# Patient Record
Sex: Male | Born: 1955
Health system: Southern US, Community
[De-identification: ages and names within clinical notes are randomized; demographics above are authoritative.]

## PROBLEM LIST (undated history)

## (undated) DIAGNOSIS — M2550 Pain in unspecified joint: Secondary | ICD-10-CM

## (undated) DIAGNOSIS — E785 Hyperlipidemia, unspecified: Secondary | ICD-10-CM

## (undated) DIAGNOSIS — I10 Essential (primary) hypertension: Secondary | ICD-10-CM

## (undated) HISTORY — PX: TONSILLECTOMY: SUR1361

## (undated) HISTORY — DX: Pain in unspecified joint: M25.50

---

## 2001-09-26 ENCOUNTER — Encounter: Payer: Self-pay | Admitting: Emergency Medicine

## 2001-09-26 ENCOUNTER — Emergency Department (HOSPITAL_COMMUNITY): Admission: EM | Admit: 2001-09-26 | Discharge: 2001-09-27 | Payer: Self-pay | Admitting: Emergency Medicine

## 2015-01-04 ENCOUNTER — Emergency Department
Admission: EM | Admit: 2015-01-04 | Discharge: 2015-01-04 | Disposition: A | Discharge status: Home / Self Care | Payer: BLUE CROSS/BLUE SHIELD | Source: Home / Self Care | Attending: Family Medicine | Admitting: Family Medicine

## 2015-01-04 ENCOUNTER — Encounter: Payer: Self-pay | Admitting: *Deleted

## 2015-01-04 DIAGNOSIS — T2220XA Burn of second degree of shoulder and upper limb, except wrist and hand, unspecified site, initial encounter: Secondary | ICD-10-CM | POA: Diagnosis not present

## 2015-01-04 HISTORY — DX: Essential (primary) hypertension: I10

## 2015-01-04 MED ORDER — TETANUS-DIPHTH-ACELL PERTUSSIS 5-2.5-18.5 LF-MCG/0.5 IM SUSP
0.5000 mL | Freq: Once | INTRAMUSCULAR | Status: AC
  Administered 2015-01-04: 0.5 mL via INTRAMUSCULAR

## 2015-01-04 MED ORDER — HYDROCODONE-ACETAMINOPHEN 5-325 MG PO TABS: 1.0000 | ORAL_TABLET | ORAL | Status: DC | PRN

## 2015-01-04 NOTE — Discharge Instructions (Signed)
Leave bandage in place until follow-up visit tomorrow.  Keep clean and dry.  Elevate arm at night.  Apply ice pack for 20 to 30 minutes, 3 to 4 times daily  Continue until pain decreases.  May take Ibuprofen , 4 tabs every 8 hours with food.    Second-Degree Burn A second-degree burn affects the 2 outer layers of skin. The outer layer (epidermis) and the layer underneath it (dermis) are both burned. Another name for this type of burn is a partial thickness burn. A second-degree burn may be called minor or major. This depends on the size of the burn. It also depends on what parts of the skin are burned. Minor burns may be treated with first aid. Major burns are a medical emergency. A second-degree burn is worse than a first-degree burn, but not as bad as a third-degree burn. A first-degree burn affects only the epidermis. A third-degree burn goes through all the layers of skin. A second-degree burn usually heals in 3 to 4 weeks. A minor second-degree burn usually does not leave a scar.Deeper second-degree burns may lead to scarring of the skin or contractures over joints.Contractures are scars that form over joints and may lead to reduced mobility at those joints. CAUSES  Heat (thermal) injury. This happens when skin comes in contact with something very hot. It could be a flame, a hot object, hot liquid, or steam. Most second-degree burns are thermal injuries.  Radiation. Sunlight is one type of radiation that can burn the skin. Another type of radiation is used to heat food. Radiation is also used to treat some diseases, such as cancer. All types of radiation can burn the skin. Sunlight usually causes a first-degree burn. Radiation used for heating food or treating a disease can cause a second-degree burn.  Electricity. Electrical burns can cause more damage under the skin than on the surface. They should always be treated as major burns.  Chemicals. Many chemicals can burn the skin. The burn  should be flushed with cool water and checked by an emergency caregiver. SYMPTOMS Symptoms of second-degree burns include:  Severe pain.  Extreme tenderness.  Deep redness.  Blistered skin.  Skin that has changed color.It might look blotchy, wet, or shiny.  Swelling. TREATMENT Some second-degree burns may need to be treated in a hospital. These include major burns, electrical burns, and chemical burns. Many other second-degree burns can be treated with regular first aid, such as:  Cooling the burn. Use cool, germ-free (sterile) salt water. Place the burned area of skin into a tub of water, or cover the burned area with clean, wet towels.  Taking pain medicine.  Removing the dead skin from broken blisters. A trained caregiver may do this. Do not pop blisters.  Gently washing your skin with mild soap.  Covering the burned area with a cream.Silver sulfadiazine is a cream for burns. An antibiotic cream, such as bacitracin, may also be used to fight infection. Do not use other ointments or creams unless your caregiver says it is okay.  Protecting the burn with a sterile, non-sticky bandage.  Bandaging fingers and toes separately. This keeps them from sticking together.  Taking an antibiotic. This can help prevent infection.  Getting a tetanus shot. HOME CARE INSTRUCTIONS Medication  Take any medicine prescribed by your caregiver. Follow the directions carefully.  Ask your caregiver if you can take over-the-counter medicine to relieve pain and swelling. Do not give aspirin to children.  Make sure your caregiver knows about  all other medicines you take.This includes over-the-counter medicines. Burn care  You will need to change the bandage on your burn. You may need to do this 2 or 3 times each day.  Gently clean the burned area.  Put ointment on it.  Cover the burn with a sterile bandage.  For some deeper burns or burns that cover a large area, compression garments  may be prescribed. These garments can help minimize scarring and protect your mobility.  Do not put butter or oil on your skin. Use only the cream prescribed by your caregiver.  Do not put ice on your burn.  Do not break blisters on your skin.  Keep the bandaged area dry. You might need to take a sponge bath for awhile.Ask your caregiver when you can take a shower or a tub bath again.  Do not scratch an itchy burn. Your caregiver may give you medicine to relieve very bad itching.  Infection is a big danger after a second-degree burn. Tell your caregiver right away if you have signs of infection, such as:  Redness or changing color in the burned area.  Fluid leaking from the burn.  Swelling in the burn area.  A bad smell coming from the wound. Follow-up  Keep all follow-up appointments.This is important. This is how your caregiver can tell if your treatment is working.  Protect your burn from sunlight.Use sunscreen whenever you go outside.Burned areas may be sensitive to the sun for up to 1 year. Exposure to the sun may also cause permanent darkening of scars. SEEK MEDICAL CARE IF:  You have any questions about medicines.  You have any questions about your treatment.  You wonder if it is okay to do a particular activity.  You develop a fever of more than 100.5 F (38.1 C). SEEK IMMEDIATE MEDICAL CARE IF:  You think your burn might be infected. It may change color, become red, leak fluid, swell, or smell bad.  You develop a fever of more than 102 F (38.9 C). Document Released: 11/07/2010 Document Revised: 08/28/2011 Document Reviewed: 11/07/2010 [facility] Patient Information 2015 [place], [place]. This information is not intended to replace advice given to you by your health care provider. Make sure you discuss any questions you have with your health care provider.
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
## 2015-01-05 ENCOUNTER — Emergency Department (INDEPENDENT_AMBULATORY_CARE_PROVIDER_SITE_OTHER)
Admission: EM | Admit: 2015-01-05 | Discharge: 2015-01-05 | Disposition: A | Discharge status: Home / Self Care | Payer: BLUE CROSS/BLUE SHIELD | Source: Home / Self Care | Attending: Family Medicine | Admitting: Family Medicine

## 2015-01-05 DIAGNOSIS — Z4800 Encounter for change or removal of nonsurgical wound dressing: Secondary | ICD-10-CM

## 2015-01-05 DIAGNOSIS — T23201D Burn of second degree of right hand, unspecified site, subsequent encounter: Secondary | ICD-10-CM | POA: Diagnosis not present

## 2015-01-05 NOTE — Discharge Instructions (Signed)
Leave present bandage in place until follow-up visit.  Keep clean and dry.  Return early if fever, increased pain or swelling occur.
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
## 2015-01-05 NOTE — ED Provider Notes (Signed)
CSN: [id number]     Arrival date & time 01/05/15  1335 History   First MD Initiated Contact with Patient 01/05/15 1359     Chief Complaint  Patient presents with  . Dressing Change      HPI Comments: Patient returns for dressing change of burn right arm.  He has no complaints today, and reports that his pain has decreased.  The history is provided by the patient.    Past Medical History  Diagnosis Date  . Hypertension    No past surgical history on file. Family History  Problem Relation Age of Onset  . COPD Father   . Heart attack Father    History  Substance Use Topics  . Smoking status: Former Games developermoker  . Smokeless tobacco: Not on file  . Alcohol Use: No    Review of Systems No fevers, chills, and sweats.  Review of systems otherwise negative Allergies  Penicillins  Home Medications   Prior to Admission medications   Medication Sig Start Date End Date Taking? Authorizing Provider  HYDROcodone-acetaminophen (NORCO/VICODIN) 5-325 MG per tablet Take 1 tablet by mouth every 4 (four) hours as needed for severe pain. 01/04/15   Lattie HawStephen A Beese, MD  losartan (COZAAR) 25 MG tablet Take 25 mg by mouth daily.    Historical Provider, MD  MULTIPLE VITAMIN PO Take by mouth.    Historical Provider, MD   BP 119/78 mmHg  Pulse 103  Temp(Src) 98 F (36.7 C) (Oral)  Resp 14  SpO2 95% Physical Exam Patient appears comfortable and in no distress. Right arm:  Bandages removed.  No purulent drainage or erythema.  No swelling  ED Course  Procedures Debrudement.  Wound lightly debrided of non-viable epidermis.   MDM   1. Dressing change or removal, nonsurgical wound    Cleansed wound with sterile saline and applied Silvadene cream.  Applied Mepelex Border dressings and secured with CoBan wrap. Leave present bandage in place until follow-up visit.  Keep clean and dry.  Return early if fever, increased pain or swelling occur. Return in two days for dressing change.    Lattie HawStephen A  Beese, MD 01/05/15 1444

## 2015-01-07 ENCOUNTER — Encounter: Payer: Self-pay | Admitting: Emergency Medicine

## 2015-01-07 ENCOUNTER — Emergency Department
Admission: EM | Admit: 2015-01-07 | Discharge: 2015-01-07 | Disposition: A | Discharge status: Home / Self Care | Payer: BLUE CROSS/BLUE SHIELD | Source: Home / Self Care

## 2015-01-07 NOTE — ED Notes (Signed)
Dressing change for burn on right arm

## 2015-01-11 ENCOUNTER — Emergency Department (INDEPENDENT_AMBULATORY_CARE_PROVIDER_SITE_OTHER)
Admission: EM | Admit: 2015-01-11 | Discharge: 2015-01-11 | Disposition: A | Discharge status: Home / Self Care | Payer: BLUE CROSS/BLUE SHIELD | Source: Home / Self Care | Attending: Family Medicine | Admitting: Family Medicine

## 2015-01-11 ENCOUNTER — Encounter: Payer: Self-pay | Admitting: Emergency Medicine

## 2015-01-11 DIAGNOSIS — Z4800 Encounter for change or removal of nonsurgical wound dressing: Secondary | ICD-10-CM | POA: Diagnosis not present
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
## 2015-01-11 NOTE — Discharge Instructions (Signed)
Dressing Change A dressing is a material placed over wounds. It keeps the wound clean, dry, and protected from further injury.  BEFORE YOU BEGIN  Get your supplies together. Things you may need include:  Salt solution (saline).  Flexible gauze bandage.  Medicated cream.  Tape.  Gloves.  Belly (abdominal) pads.  Gauze squares.  Plastic bags.  Take pain medicine 30 minutes before the bandage change if you need it.  Take a shower before you do the first bandage change of the day. Put plastic wrap or a bag over the dressing. REMOVING YOUR OLD BANDAGE  Wash your hands with soap and water. Dry your hands with a clean towel.  Put on your gloves.  Remove any tape.  Remove the old bandage as told. If it sticks, put a small amount of warm water on it to loosen the bandage.  Remove any gauze or packing tape in your wound.  Take off your gloves.  Put the gloves, tape, gauze, or any packing tape in a plastic bag. CHANGING YOUR BANDAGE  Open the supplies.  Take the cap off the salt solution.  Open the gauze. Leave the gauze on the inside of the package.  Put on your gloves.  Clean your wound as told by your doctor.  Keep your wound dry if your doctor told you to do so.  Your doctor may tell you to do one or more of the following:  Pick up the gauze. Pour the salt solution over the gauze. Squeeze out the extra salt solution.  Put medicated cream or other medicine on your wound.  Put solution soaked gauze only in your wound, not on the skin around it.  Pack your wound loosely.  Put dry gauze on your wound.  Put belly pads over the dry gauze if your bandages soak through.  Tape the bandage in place so it will not fall off. Do not wrap the tape all the way around your arm or leg.  Wrap the bandage with the flexible gauze bandage as told by your doctor.  Take off your gloves. Put them in the plastic bag with the old bandage. Tie the bag shut and throw it  away.  Keep the bandage clean and dry.  Wash your hands. GET HELP RIGHT AWAY IF:   Your skin around the wound looks red.  Your wound feels more tender or sore.  You see yellowish-white fluid (pus) in the wound.  Your wound smells bad.  You have a fever.  Your skin around the wound has a red rash that itches and burns.  You see black or yellow skin in your wound that was not there before.  You feel sick to your stomach (nauseous), throw up (vomit), and feel very tired. Document Released: 09/01/2008 Document Revised: 10/20/2013 Document Reviewed: 04/16/2011 [facility] Patient Information 2015 [place], [place]. This information is not intended to replace advice given to you by your health care provider. Make sure you discuss any questions you have with your health care provider.  Wound Care Wound care helps prevent pain and infection.  You may need a tetanus shot if:  You cannot remember when you had your last tetanus shot.  You have never had a tetanus shot.  The injury broke your skin. If you need a tetanus shot and you choose not to have one, you may get tetanus. Sickness from tetanus can be serious. HOME CARE   Only take medicine as told by your doctor.  Clean the wound daily  with mild soap and water.  Change any bandages (dressings) as told by your doctor.  Put medicated cream and a bandage on the wound as told by your doctor.  Change the bandage if it gets wet, dirty, or starts to smell.  Take showers. Do not take baths, swim, or do anything that puts your wound under water.  Rest and raise (elevate) the wound until the pain and puffiness (swelling) are better.  Keep all doctor visits as told. GET HELP RIGHT AWAY IF:   Yellowish-white fluid (pus) comes from the wound.  Medicine does not lessen your pain.  There is a red streak going away from the wound.  You have a fever. MAKE SURE YOU:   Understand these instructions.  Will watch your  condition.  Will get help right away if you are not doing well or get worse. Document Released: 03/14/2008 Document Revised: 08/28/2011 Document Reviewed: 10/09/2010 [facility] Patient Information 2015 [place], [place]. This information is not intended to replace advice given to you by your health care provider. Make sure you discuss any questions you have with your health care provider.

## 2015-01-13 ENCOUNTER — Encounter: Payer: Self-pay | Admitting: *Deleted

## 2015-01-13 ENCOUNTER — Emergency Department (INDEPENDENT_AMBULATORY_CARE_PROVIDER_SITE_OTHER)
Admission: EM | Admit: 2015-01-13 | Discharge: 2015-01-13 | Disposition: A | Discharge status: Home / Self Care | Payer: BLUE CROSS/BLUE SHIELD | Source: Home / Self Care | Attending: Family Medicine | Admitting: Family Medicine

## 2015-01-13 DIAGNOSIS — Z4800 Encounter for change or removal of nonsurgical wound dressing: Secondary | ICD-10-CM | POA: Diagnosis not present
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
## 2015-01-13 NOTE — Discharge Instructions (Signed)
Leave bandage in place for two days then remove. After healed, apply scar cream such as Maderma Skin Care.  Minimize sun exposure.
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
## 2017-12-14 ENCOUNTER — Emergency Department
Admission: EM | Admit: 2017-12-14 | Discharge: 2017-12-14 | Disposition: A | Discharge status: Home / Self Care | Payer: BLUE CROSS/BLUE SHIELD | Source: Home / Self Care | Attending: Family Medicine | Admitting: Family Medicine

## 2017-12-14 ENCOUNTER — Other Ambulatory Visit: Payer: Self-pay

## 2017-12-14 ENCOUNTER — Emergency Department (INDEPENDENT_AMBULATORY_CARE_PROVIDER_SITE_OTHER): Payer: BLUE CROSS/BLUE SHIELD

## 2017-12-14 DIAGNOSIS — M25472 Effusion, left ankle: Secondary | ICD-10-CM

## 2017-12-14 DIAGNOSIS — S8255XA Nondisplaced fracture of medial malleolus of left tibia, initial encounter for closed fracture: Secondary | ICD-10-CM

## 2017-12-14 DIAGNOSIS — M7732 Calcaneal spur, left foot: Secondary | ICD-10-CM | POA: Diagnosis not present

## 2017-12-14 DIAGNOSIS — M7731 Calcaneal spur, right foot: Secondary | ICD-10-CM | POA: Diagnosis not present

## 2017-12-14 DIAGNOSIS — M79671 Pain in right foot: Secondary | ICD-10-CM

## 2017-12-14 DIAGNOSIS — M25471 Effusion, right ankle: Secondary | ICD-10-CM

## 2017-12-14 DIAGNOSIS — S93401A Sprain of unspecified ligament of right ankle, initial encounter: Secondary | ICD-10-CM

## 2017-12-14 DIAGNOSIS — M25571 Pain in right ankle and joints of right foot: Secondary | ICD-10-CM

## 2017-12-14 MED ORDER — TRAMADOL HCL 50 MG PO TABS: 50.0000 mg | ORAL_TABLET | Freq: Four times a day (QID) | ORAL | 0 refills | Status: DC | PRN

## 2017-12-14 NOTE — Discharge Instructions (Signed)
°  Tramadol is strong pain medication. While taking, do not drink alcohol, drive, or perform any other activities that requires focus while taking these medications.   You may also take Tylenol and Motrin as needed for pain and swelling.    Please call to schedule a follow up appointment with Dr. Benjamin Stainhekkekandam (Dr. Karie Schwalbe), sports medicine next week for further evaluation and treatment plan.
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
## 2017-12-14 NOTE — ED Provider Notes (Signed)
[name]    CSN: [id number] Arrival date & time: 12/14/17  1713     History   Chief Complaint Chief Complaint  Patient presents with  . Ankle Pain    HPI Matthew Day is a 62 y.o. male.   HPI Matthew Day is a 62 y.o. male presenting to UC with c/o bilateral ankle pain.  About 1 week ago he fell over a trailer hitch and landed with his Left ankle/foot under his buttock.  Pain is constant aching and sore, worse with weight bearing. Associated swelling. Wife has noticed him limping ever since then.  Today, pt fell off a lawn mower and rolled his Right ankle.  Pain is more severe in this ankle than the left but no swelling or bruising.  Hx of an occult fracture of his 4th metatarsal several years ago, he needed to be in a boot then cast and crutches for several months and still has occasional pain in his Right foot. No surgery on that foot.    Past Medical History:  Diagnosis Date  . Hypertension     There are no active problems to display for this patient.   History reviewed. No pertinent surgical history.     Home Medications    Prior to Admission medications   Medication Sig Start Date End Date Taking? Authorizing Provider  HYDROcodone-acetaminophen (NORCO/VICODIN) 5-325 MG per tablet Take 1 tablet by mouth every 4 (four) hours as needed for severe pain. 01/04/15   Lattie Haw, MD  losartan (COZAAR) 25 MG tablet Take 25 mg by mouth daily.    [provider]  MULTIPLE VITAMIN PO Take by mouth.    [provider]  traMADol (ULTRAM) 50 MG tablet Take 1 tablet (50 mg total) by mouth every 6 (six) hours as needed. 12/14/17   Lurene Shadow, PA-C    Family History Family History  Problem Relation Age of Onset  . COPD Father   . Heart attack Father     Social History Social History   Tobacco Use  . Smoking status: Former Smoker  Substance Use Topics  . Alcohol use: Yes  . Drug use: No     Allergies    Penicillins   Review of Systems Review of Systems  Musculoskeletal: Positive for arthralgias, gait problem and joint swelling. Negative for myalgias.  Skin: Negative for color change, rash and wound.  Neurological: Negative for weakness and numbness.     Physical Exam Triage Vital Signs ED Triage Vitals  Enc Vitals Group     BP      Pulse      Resp      Temp      Temp src      SpO2      Weight      Height      Head Circumference      Peak Flow      Pain Score      Pain Loc      Pain Edu?      Excl. in GC?    No data found.  Updated Vital Signs BP (!) 133/92 (BP Location: Right Arm)   Pulse 78   Temp 98.3 F (36.8 C) (Oral)   Ht 5' 10.5" (1.791 m)   Wt 270 lb (122.5 kg)   SpO2 96%   BMI 38.19 kg/m   Visual Acuity Right Eye Distance:   Left Eye Distance:   Bilateral Distance:    Right  Eye Near:   Left Eye Near:    Bilateral Near:     Physical Exam  Constitutional: He is oriented to person, place, and time. He appears well-developed and well-nourished. No distress.  HENT:  Head: Normocephalic and atraumatic.  Eyes: EOM are normal.  Neck: Normal range of motion.  Cardiovascular: Normal rate.  Pulses:      Dorsalis pedis pulses are 2+ on the right side, and 2+ on the left side.  Pulmonary/Chest: Effort normal.  Musculoskeletal: Normal range of motion. He exhibits edema and tenderness.  Left ankle and foot: moderate edema. Tenderness to lateral aspect. Full ROM toes and ankle. Calf is soft, non-tender. Right ankle and foot: no edema. Tenderness to lateral aspect. Full ROM toes and ankle. Calf is soft, non-tender.  Neurological: He is alert and oriented to person, place, and time.  Skin: Skin is warm and dry. Capillary refill takes less than 2 seconds. He is not diaphoretic.  Bilateral ankles: skin in tact. No ecchymosis or erythema.  Psychiatric: He has a normal mood and affect. His behavior is normal.  Nursing note and vitals reviewed.    UC  Treatments / Results  Labs (all labs ordered are listed, but only abnormal results are displayed) Labs Reviewed - No data to display  EKG None  Radiology Dg Ankle Complete Left  Result Date: 12/14/2017 CLINICAL DATA:  Initial evaluation for acute trauma, recent fall. EXAM: LEFT ANKLE COMPLETE - 3+ VIEW COMPARISON:  None. FINDINGS: Curvilinear lucency extending through the distal aspect of the medial malleolus, age indeterminate, but could reflect an acute nondisplaced fracture. No other acute fracture or dislocation. Ankle mortise approximated. Talar dome intact. Mild diffuse soft tissue swelling about the ankle. Posterior plantar calcaneal enthesophytes noted. Degenerative spurring noted at the dorsal midfoot. IMPRESSION: 1. Linear lucency traversing the distal left medial malleolus. While this finding is favored to be chronic in nature, a possible acute nondisplaced fracture not entirely excluded. Correlation with physical exam for possible pain at this location recommended. 2. No other acute osseous abnormality. 3. Mild diffuse soft tissue swelling about the ankle. Electronically Signed   By: Rise MuBenjamin  McClintock M.D.   On: 12/14/2017 18:10   Dg Ankle Complete Right  Result Date: 12/14/2017 CLINICAL DATA:  Initial evaluation for acute trauma, fall. EXAM: RIGHT ANKLE - COMPLETE 3+ VIEW COMPARISON:  None. FINDINGS: No acute fracture or dislocation. Ankle mortise approximated. Talar dome intact. Posterior and plantar calcaneal enthesophytes. Degenerative spurring present at the dorsal midfoot. No acute soft tissue abnormality. IMPRESSION: No acute osseous abnormality about the right ankle. Electronically Signed   By: Rise MuBenjamin  McClintock M.D.   On: 12/14/2017 18:19   Dg Foot Complete Left  Result Date: 12/14/2017 CLINICAL DATA:  Initial evaluation for acute trauma, recent fall. EXAM: LEFT FOOT - COMPLETE 3+ VIEW COMPARISON:  None. FINDINGS: No acute fracture or dislocation. Osseous mineralization  within normal limits. No acute soft tissue abnormality. Plantar calcaneal enthesophyte noted. Degenerative spurring noted at the first MTP joint. IMPRESSION: No acute osseous abnormality about the left foot. Electronically Signed   By: Rise MuBenjamin  McClintock M.D.   On: 12/14/2017 18:15   Dg Foot Complete Right  Result Date: 12/14/2017 CLINICAL DATA:  Initial evaluation for acute trauma, fall. EXAM: RIGHT FOOT COMPLETE - 3+ VIEW COMPARISON:  None. FINDINGS: No acute fracture or dislocation. Osseous mineralization normal. No acute soft tissue abnormality. Degenerative spurring present at the dorsal talonavicular joint. Posterior plantar calcaneal enthesophytes noted. IMPRESSION: No acute osseous abnormality about the  right foot. Electronically Signed   By: Rise Mu M.D.   On: 12/14/2017 18:17    Procedures Procedures (including critical care time)  Medications Ordered in UC Medications - No data to display  Initial Impression / Assessment and Plan / UC Course  I have reviewed the triage vital signs and the nursing notes.  Pertinent labs & imaging results that were available during my care of the patient were reviewed by me and considered in my medical decision making (see chart for details).     Discussed imaging with pt.  Although no tenderness to medial aspect of Left ankle, there is still moderate  Swelling and pain 1 week after initial injury, will tx as new fracture. Boot applied and crutches provided. ASO applied to Right ankle Home instructions provided.  Final Clinical Impressions(s) / UC Diagnoses   Final diagnoses:  Right foot pain  Pain and swelling of right ankle  Nondisplaced fracture of medial malleolus of left tibia, initial encounter for closed fracture  Sprain of right ankle, unspecified ligament, initial encounter     Discharge Instructions      Tramadol is strong pain medication. While taking, do not drink alcohol, drive, or perform any other activities  that requires focus while taking these medications.   You may also take Tylenol and Motrin as needed for pain and swelling.    Please call to schedule a follow up appointment with Dr. Benjamin Stain (Dr. Karie Schwalbe), sports medicine next week for further evaluation and treatment plan.     ED Prescriptions    Medication Sig Dispense Auth. Provider   traMADol (ULTRAM) 50 MG tablet Take 1 tablet (50 mg total) by mouth every 6 (six) hours as needed. 15 tablet Lurene Shadow, PA-C     Controlled Substance Prescriptions [place] Controlled Substance Registry consulted? Yes, I have consulted the [place] Controlled Substances Registry for this patient, and feel the risk/benefit ratio today is favorable for proceeding with this prescription for a controlled substance.   Lurene Shadow, [place] 12/15/17 1221

## 2017-12-16 ENCOUNTER — Telehealth: Payer: Self-pay

## 2017-12-16 NOTE — Telephone Encounter (Signed)
Left message on VM to call UC if any questions or problems.

## 2017-12-27 ENCOUNTER — Encounter: Payer: Self-pay | Admitting: Sports Medicine

## 2017-12-27 ENCOUNTER — Ambulatory Visit: Payer: BLUE CROSS/BLUE SHIELD | Admitting: Sports Medicine

## 2017-12-27 DIAGNOSIS — S8252XA Displaced fracture of medial malleolus of left tibia, initial encounter for closed fracture: Secondary | ICD-10-CM | POA: Insufficient documentation

## 2017-12-27 DIAGNOSIS — S8255XA Nondisplaced fracture of medial malleolus of left tibia, initial encounter for closed fracture: Secondary | ICD-10-CM | POA: Diagnosis not present

## 2017-12-27 MED ORDER — CALCIUM CARBONATE-VITAMIN D 600-400 MG-UNIT PO TABS: 1.0000 | ORAL_TABLET | Freq: Two times a day (BID) | ORAL | 11 refills | Status: DC

## 2017-12-27 NOTE — Assessment & Plan Note (Signed)
Fracture occurred essentially 3 weeks ago, continue boot, adding a calcium and vitamin D supplement. Return to see me in 3 weeks, x-ray before visit, at that point we will probably put him in an ASO 2 more weeks and then he can weight-bear without any bracing. He really has no pain.  I billed a fracture code for this encounter, all subsequent visits will be post-op checks in the global period.

## 2017-12-27 NOTE — Progress Notes (Signed)
Subjective:    I'm seeing this patient as a consultation for: Dr. Donna ChristenStephen Beese  CC: Left ankle injury  HPI: This is a very pleasant 62 year old male, for the past 2-1/2 to 3 weeks he has been walking on a broken left ankle, he had an inversion injury, he was finally seen in urgent care where x-rays showed a fracture through the medial malleolus, he was appropriately placed in a boot and referred to me for further evaluation and definitive treatment, pain is minimal if any at all and localized at the medial malleolus, he does have some discomfort at the lateral malleolus as well.  I reviewed the past medical history, family history, social history, surgical history, and allergies today and no changes were needed.  Please see the problem list section below in epic for further details.  Past Medical History: Past Medical History:  Diagnosis Date  . Hypertension   . Joint pain    Past Surgical History: Past Surgical History:  Procedure Laterality Date  . TONSILLECTOMY     Social History: Social History   Socioeconomic History  . Marital status: Married    Spouse name: Not on file  . Number of children: 2  . Years of education: Not on file  . Highest education level: Not on file  Occupational History  . Not on file  Social Needs  . Financial resource strain: Not on file  . Food insecurity:    Worry: Not on file    Inability: Not on file  . Transportation needs:    Medical: Not on file    Non-medical: Not on file  Tobacco Use  . Smoking status: Former Games developermoker  . Smokeless tobacco: Never Used  Substance and Sexual Activity  . Alcohol use: Yes    Frequency: Never  . Drug use: Never  . Sexual activity: Not on file  Lifestyle  . Physical activity:    Days per week: Not on file    Minutes per session: Not on file  . Stress: Not on file  Relationships  . Social connections:    Talks on phone: Not on file    Gets together: Not on file    Attends religious service: Not  on file    Active member of club or organization: Not on file    Attends meetings of clubs or organizations: Not on file    Relationship status: Not on file  Other Topics Concern  . Not on file  Social History Narrative  . Not on file   Family History: Family History  Problem Relation Age of Onset  . COPD Father   . Heart attack Father    Allergies: Allergies  Allergen Reactions  . Penicillins Swelling   Medications: See med rec.  Review of Systems: No headache, visual changes, nausea, vomiting, diarrhea, constipation, dizziness, abdominal pain, skin rash, fevers, chills, night sweats, weight loss, swollen lymph nodes, body aches, joint swelling, muscle aches, chest pain, shortness of breath, mood changes, visual or auditory hallucinations.   Objective:   General: Well Developed, well nourished, and in no acute distress.  Neuro:  Extra-ocular muscles intact, able to move all 4 extremities, sensation grossly intact.  Deep tendon reflexes tested were normal. Psych: Alert and oriented, mood congruent with affect. ENT:  Ears and nose appear unremarkable.  Hearing grossly normal. Neck: Unremarkable overall appearance, trachea midline.  No visible thyroid enlargement. Eyes: Conjunctivae and lids appear unremarkable.  Pupils equal and round. Skin: Warm and dry, no rashes  noted.  Cardiovascular: Pulses palpable, no extremity edema. Left ankle: No visible erythema or swelling. Range of motion is full in all directions. Strength is 5/5 in all directions. Stable lateral and medial ligaments; squeeze test and kleiger test unremarkable; Talar dome nontender; No pain at base of 5th MT; No tenderness over cuboid; No tenderness over N spot or navicular prominence Tender to palpation over the medial malleolus as well as over the ATFL No sign of peroneal tendon subluxations; Negative tarsal tunnel tinel's Able to walk 4 steps.  X-rays personally reviewed, they show linear fracture through  the medial malleolus, nondisplaced  Impression and Recommendations:   This case required medical decision making of moderate complexity.  Fracture of ankle, medial malleolus, left, closed Fracture occurred essentially 3 weeks ago, continue boot, adding a calcium and vitamin D supplement. Return to see me in 3 weeks, x-ray before visit, at that point we will probably put him in an ASO 2 more weeks and then he can weight-bear without any bracing. He really has no pain.  I billed a fracture code for this encounter, all subsequent visits will be post-op checks in the global period. ___________________________________________ Ihor Austin. Benjamin Stain, M.D., ABFM., CAQSM. Primary Care and Sports Medicine [facility]  Adjunct Instructor of Family Medicine  [employer]

## 2018-01-22 ENCOUNTER — Ambulatory Visit (INDEPENDENT_AMBULATORY_CARE_PROVIDER_SITE_OTHER): Payer: BLUE CROSS/BLUE SHIELD | Admitting: Sports Medicine

## 2018-01-22 ENCOUNTER — Encounter: Payer: Self-pay | Admitting: Sports Medicine

## 2018-01-22 ENCOUNTER — Ambulatory Visit (INDEPENDENT_AMBULATORY_CARE_PROVIDER_SITE_OTHER): Payer: BLUE CROSS/BLUE SHIELD

## 2018-01-22 DIAGNOSIS — S8255XD Nondisplaced fracture of medial malleolus of left tibia, subsequent encounter for closed fracture with routine healing: Secondary | ICD-10-CM | POA: Diagnosis not present

## 2018-01-22 DIAGNOSIS — S8255XA Nondisplaced fracture of medial malleolus of left tibia, initial encounter for closed fracture: Secondary | ICD-10-CM

## 2018-01-22 DIAGNOSIS — W19XXXD Unspecified fall, subsequent encounter: Secondary | ICD-10-CM

## 2018-01-22 DIAGNOSIS — S8255XK Nondisplaced fracture of medial malleolus of left tibia, subsequent encounter for closed fracture with nonunion: Secondary | ICD-10-CM

## 2018-01-22 NOTE — Progress Notes (Signed)
  Subjective: This is a pleasant 62 year old male, I saw him about 3 weeks ago, he had been walking on an ankle for 3 weeks after an injury.  We obtained x-rays that showed a linear fracture, nondisplaced to the medial malleolus.  There is no shift of the mortise.  We placed him in a boot and he returns here, mostly pain-free, still with some discomfort at the medial malleolus.  Objective: General: Well-developed, well-nourished, and in no acute distress. Right ankle: No visible erythema or swelling. Range of motion is full in all directions. Strength is 5/5 in all directions. Stable lateral and medial ligaments; squeeze test and kleiger test unremarkable; Talar dome nontender; No pain at base of 5th MT; No tenderness over cuboid; No tenderness over N spot or navicular prominence Mild tenderness of the anterior aspect of the medial malleolus No sign of peroneal tendon subluxations; Negative tarsal tunnel tinel's Able to walk 4 steps.  X-rays do show some distraction between the fracture fragments, does not appear to be simple resorption.  Assessment/plan:   Fracture of ankle, medial malleolus, left, closed Matthew Day walked on the fracture for 3 weeks before presenting, he has been in a boot for the last 3 weeks, I am seeing some increased distraction of the fracture fragments that does not look like simple resorption. He really does not have much pain and tells me he cannot proceed with surgical intervention at this time. Transition into an ASO, he does not need any pain medication. I would like to get a second opinion from Dr. Luiz BlareGraves but he tells me he would not be able to do any operative intervention until December due to his work. ___________________________________________ Matthew Day, M.D., ABFM., CAQSM. Primary Care and Sports Medicine [facility]  Adjunct Instructor of Family Medicine  [employer]

## 2018-01-22 NOTE — Assessment & Plan Note (Signed)
Matthew Day walked on the fracture for 3 weeks before presenting, he has been in a boot for the last 3 weeks, I am seeing some increased distraction of the fracture fragments that does not look like simple resorption. He really does not have much pain and tells me he cannot proceed with surgical intervention at this time. Transition into an ASO, he does not need any pain medication. I would like to get a second opinion from Dr. Luiz BlareGraves but he tells me he would not be able to do any operative intervention until December due to his work.

## 2018-05-18 ENCOUNTER — Encounter: Payer: Self-pay | Admitting: Emergency Medicine

## 2018-05-18 ENCOUNTER — Emergency Department
Admission: EM | Admit: 2018-05-18 | Discharge: 2018-05-18 | Disposition: A | Discharge status: Home / Self Care | Payer: BLUE CROSS/BLUE SHIELD | Source: Home / Self Care

## 2018-05-18 DIAGNOSIS — J22 Unspecified acute lower respiratory infection: Secondary | ICD-10-CM

## 2018-05-18 MED ORDER — ALBUTEROL SULFATE HFA 108 (90 BASE) MCG/ACT IN AERS: 1.0000 | INHALATION_SPRAY | Freq: Four times a day (QID) | RESPIRATORY_TRACT | 0 refills | Status: DC | PRN

## 2018-05-18 MED ORDER — AZITHROMYCIN 250 MG PO TABS: 250.0000 mg | ORAL_TABLET | Freq: Every day | ORAL | 0 refills | Status: DC

## 2018-05-18 MED ORDER — IPRATROPIUM-ALBUTEROL 0.5-2.5 (3) MG/3ML IN SOLN
3.0000 mL | Freq: Once | RESPIRATORY_TRACT | Status: AC
  Administered 2018-05-18: 3 mL via RESPIRATORY_TRACT

## 2018-05-18 MED ORDER — PREDNISONE 20 MG PO TABS: 20.0000 mg | ORAL_TABLET | Freq: Two times a day (BID) | ORAL | 0 refills | Status: DC

## 2018-05-18 MED ORDER — BENZONATATE 200 MG PO CAPS: 200.0000 mg | ORAL_CAPSULE | Freq: Two times a day (BID) | ORAL | 0 refills | Status: DC | PRN

## 2018-05-18 NOTE — ED Provider Notes (Signed)
[name]    CSN: [id number] Arrival date & time: 05/18/18  1123     History   Chief Complaint Chief Complaint  Patient presents with  . Cough    HPI Matthew Day is a 62 y.o. male.   HPI  Matthew Day is a pleasant 62 year old gentleman with well-controlled hypertension.  He is here today for coughing.  He is been coughing for 4 weeks.  He does not think he has had any fever or chills.  He is not coughing up very much sputum.  He does not have any congestion or runny nose.  His chest feels "tight".  For the last week he has become increasingly short of breath.  He cannot walk up a flight of stairs.  He is waking up at night with coughing and difficulty breathing. He does not have any heart problems.  No swelling or edema.  No history of heart failure. He does not smoke cigarettes.  He does not have any history of underlying asthma or COPD.  He is never had to use an inhaler before. No known exposure to illness. He states he is feeling a bit more tired, some body aches.  He thinks is from the sleep deprivation from all the coughing. No nausea or vomiting.  Past Medical History:  Diagnosis Date  . Hypertension   . Joint pain     Patient Active Problem List   Diagnosis Date Noted  . Fracture of ankle, medial malleolus, left, closed 12/27/2017    Past Surgical History:  Procedure Laterality Date  . TONSILLECTOMY         Home Medications    Prior to Admission medications   Medication Sig Start Date End Date Taking? Authorizing Provider  albuterol (PROVENTIL HFA;VENTOLIN HFA) 108 (90 Base) MCG/ACT inhaler Inhale 1-2 puffs into the lungs every 6 (six) hours as needed for wheezing or shortness of breath. 05/18/18   Matthew Moore, MD  azithromycin (ZITHROMAX) 250 MG tablet Take 1 tablet (250 mg total) by mouth daily. Take first 2 tablets together, then 1 every day until finished. 05/18/18   Matthew Moore, MD  benzonatate (TESSALON) 200 MG capsule  Take 1 capsule (200 mg total) by mouth 2 (two) times daily as needed for cough. 05/18/18   Matthew Moore, MD  Calcium Carbonate-Vitamin D 600-400 MG-UNIT tablet Take 1 tablet by mouth 2 (two) times daily. 12/27/17   Monica Becton, MD  losartan (COZAAR) 25 MG tablet Take 25 mg by mouth daily.    [provider]  MULTIPLE VITAMIN PO Take by mouth.    [provider]  predniSONE (DELTASONE) 20 MG tablet Take 1 tablet (20 mg total) by mouth 2 (two) times daily with a meal. 05/18/18   Matthew Moore, MD    Family History Family History  Problem Relation Age of Onset  . COPD Father   . Heart attack Father     Social History Social History   Tobacco Use  . Smoking status: Former Games developer  . Smokeless tobacco: Never Used  Substance Use Topics  . Alcohol use: Yes    Frequency: Never  . Drug use: Never     Allergies   Penicillins   Review of Systems Review of Systems  Constitutional: Negative for chills and fever.  HENT: Negative for ear pain and sore throat.   Eyes: Negative for pain and visual disturbance.  Respiratory: Positive for cough, shortness of breath and wheezing.   Cardiovascular:  Negative for chest pain and palpitations.  Gastrointestinal: Negative for abdominal pain and vomiting.  Genitourinary: Negative for dysuria and hematuria.  Musculoskeletal: Negative for arthralgias and back pain.  Skin: Negative for color change and rash.  Neurological: Negative for seizures and syncope.  All other systems reviewed and are negative.    Physical Exam Triage Vital Signs ED Triage Vitals  Enc Vitals Group     BP 05/18/18 1136 (!) 155/88     Pulse Rate 05/18/18 1136 84     Resp --      Temp 05/18/18 1136 98.6 F (37 C)     Temp Source 05/18/18 1136 Oral     SpO2 05/18/18 1136 94 %     Weight 05/18/18 1138 283 lb (128.4 kg)     Height 05/18/18 1138 5\' 10"  (1.778 m)     Head Circumference --      Peak Flow --      Pain Score 05/18/18  1138 0     Pain Loc --      Pain Edu? --      Excl. in GC? --    No data found.  Updated Vital Signs BP (!) 155/88 (BP Location: Right Arm)   Pulse 84   Temp 98.6 F (37 C) (Oral)   Ht 5\' 10"  (1.778 m)   Wt 128.4 kg   SpO2 94%   BMI 40.61 kg/m   Visual Acuity Right Eye Distance:   Left Eye Distance:   Bilateral Distance:    Right Eye Near:   Left Eye Near:    Bilateral Near:     Physical Exam  Constitutional: He appears well-developed and well-nourished. No distress.  HENT:  Head: Normocephalic and atraumatic.  Mouth/Throat: Oropharynx is clear and moist.  Eyes: Pupils are equal, round, and reactive to light. Conjunctivae are normal.  Neck: Normal range of motion.  Cardiovascular: Normal rate, regular rhythm and normal heart sounds.  Pulmonary/Chest:  Patient with audible wheeze.  Slight tachypnea.  Coughs frequently throughout conversation.  Wheezes heard in both lung fields.  Post nebulizer:  Abdominal: Soft. He exhibits no distension.  Musculoskeletal: Normal range of motion. He exhibits no edema.  Neurological: He is alert.  Skin: Skin is warm and dry.  Psychiatric: He has a normal mood and affect. His behavior is normal.   Recheck after nebulizer treatment reveals lungs that are clear.  Is able to take deep inspiration.  UC Treatments / Results  Labs (all labs ordered are listed, but only abnormal results are displayed) Labs Reviewed - No data to display  EKG None  Radiology No results found.  Procedures Procedures (including critical care time)  Medications Ordered in UC Medications  ipratropium-albuterol (DUONEB) 0.5-2.5 (3) MG/3ML nebulizer solution 3 mL (3 mLs Nebulization Given 05/18/18 1149)    Initial Impression / Assessment and Plan / UC Course  I have reviewed the triage vital signs and the nursing notes.  Pertinent labs & imaging results that were available during my care of the patient were reviewed by me and considered in my medical  decision making (see chart for details).    I reviewed with the patient that most of the respiratory infections that resulted in wheezing are caused by a respiratory virus.  I cannot guarantee that an antibiotic will help, but after 4 weeks of coughing and worsening symptoms I believe covering him with an antibiotic is reasonable.  For the wheezing I am giving him prednisone and albuterol inhaler.  For  the cough I am going to give him Tessalon.  He knows to follow-up with his PCP if not better by next week, or return to office Final Clinical Impressions(s) / UC Diagnoses   Final diagnoses:  LRTI (lower respiratory tract infection)     Discharge Instructions     Drink plenty of fluids Take prednisone 2 times a day for 5 days Take 2 doses today Take azithromycin as directed Take 2 tablets today, then 1 a day until gone Take the Tessalon pill 2 or 3 times a day for cough, take as needed Use albuterol inhaler every 4-6 hours for wheeze, use as needed Follow-up if not improving by next week    ED Prescriptions    Medication Sig Dispense Auth. Provider   predniSONE (DELTASONE) 20 MG tablet Take 1 tablet (20 mg total) by mouth 2 (two) times daily with a meal. 10 tablet Matthew MooreNelson, Yvonne Sue, MD   benzonatate (TESSALON) 200 MG capsule Take 1 capsule (200 mg total) by mouth 2 (two) times daily as needed for cough. 20 capsule Matthew MooreNelson, Yvonne Sue, MD   azithromycin (ZITHROMAX) 250 MG tablet Take 1 tablet (250 mg total) by mouth daily. Take first 2 tablets together, then 1 every day until finished. 6 tablet Matthew MooreNelson, Yvonne Sue, MD   albuterol (PROVENTIL HFA;VENTOLIN HFA) 108 (90 Base) MCG/ACT inhaler Inhale 1-2 puffs into the lungs every 6 (six) hours as needed for wheezing or shortness of breath. 1 Inhaler Matthew MooreNelson, Yvonne Sue, MD     Controlled Substance Prescriptions [place] Controlled Substance Registry consulted? Not Applicable   Matthew MooreNelson, Yvonne Sue, MD 05/18/18 1325

## 2018-05-18 NOTE — ED Triage Notes (Signed)
Patient c/o chest cold x 1 month, thought was getting better, now back, SOB, productive cough, HA, body aches.

## 2018-05-18 NOTE — Discharge Instructions (Addendum)
Drink plenty of fluids Take prednisone 2 times a day for 5 days Take 2 doses today Take azithromycin as directed Take 2 tablets today, then 1 a day until gone Take the Tessalon pill 2 or 3 times a day for cough, take as needed Use albuterol inhaler every 4-6 hours for wheeze, use as needed Follow-up if not improving by next week

## 2018-09-16 IMAGING — DX DG ANKLE COMPLETE 3+V*L*
3 series · 3 of 3 positions shown · non-contrast
Comparison: Radiographs dated 12/14/2017

CLINICAL DATA: Fracture of the medial malleolus.

EXAM:
LEFT ANKLE COMPLETE - 3+ VIEW

[ankle ap]
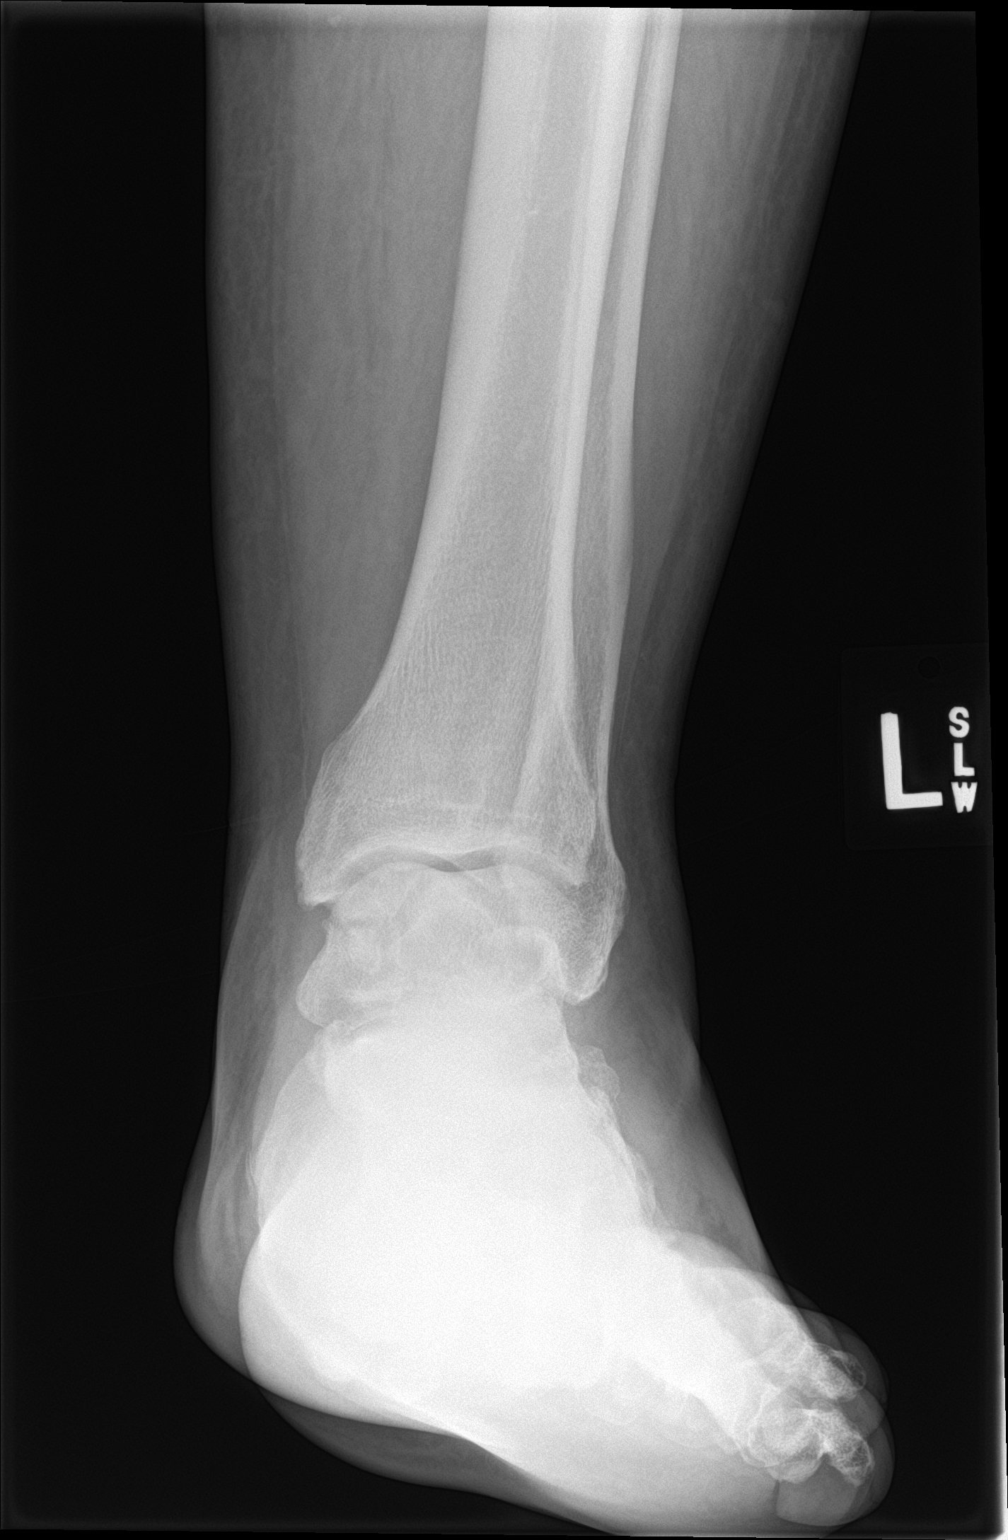

[ankle obl]
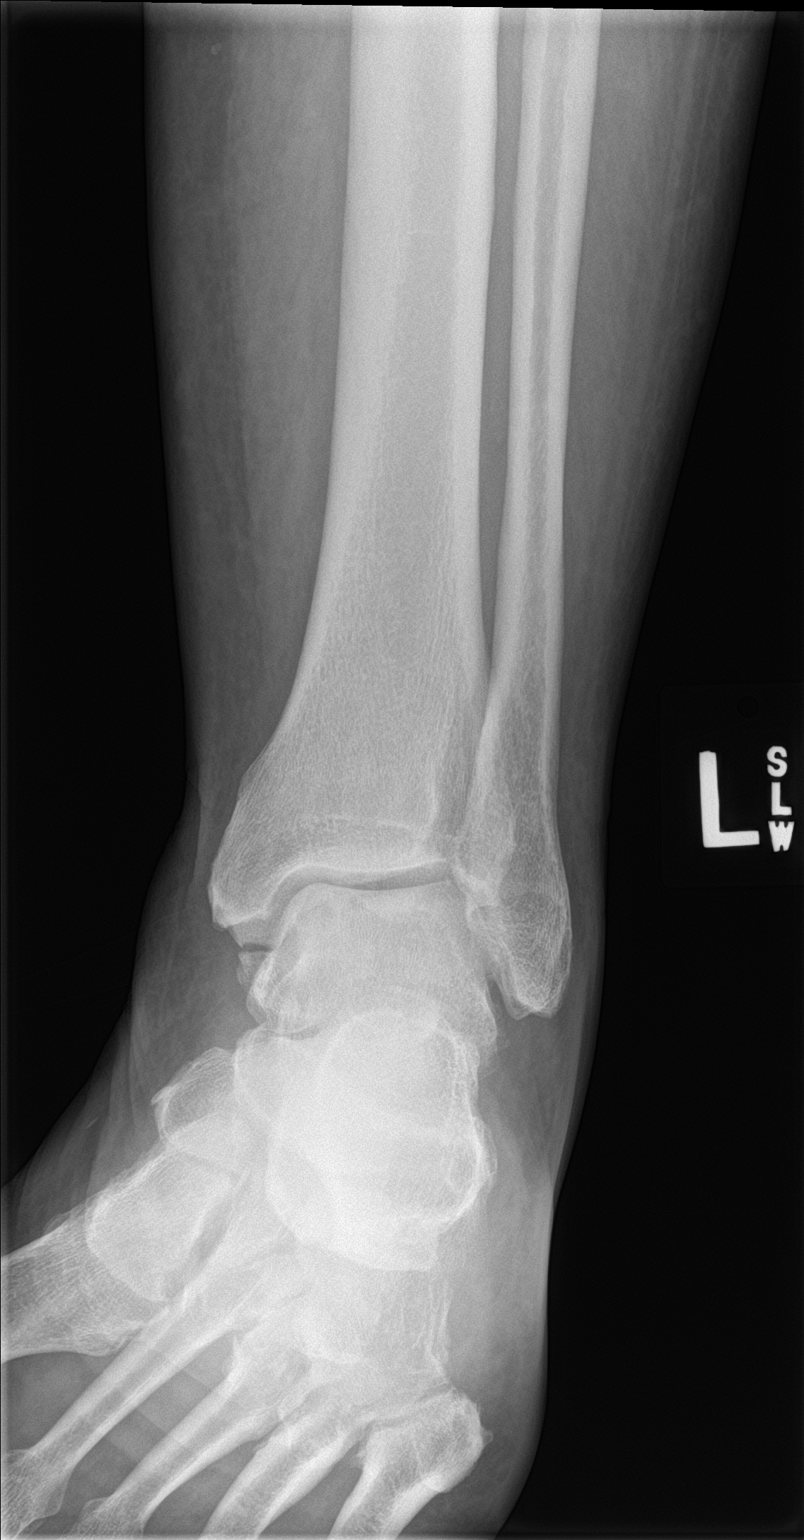

[ankle lat]
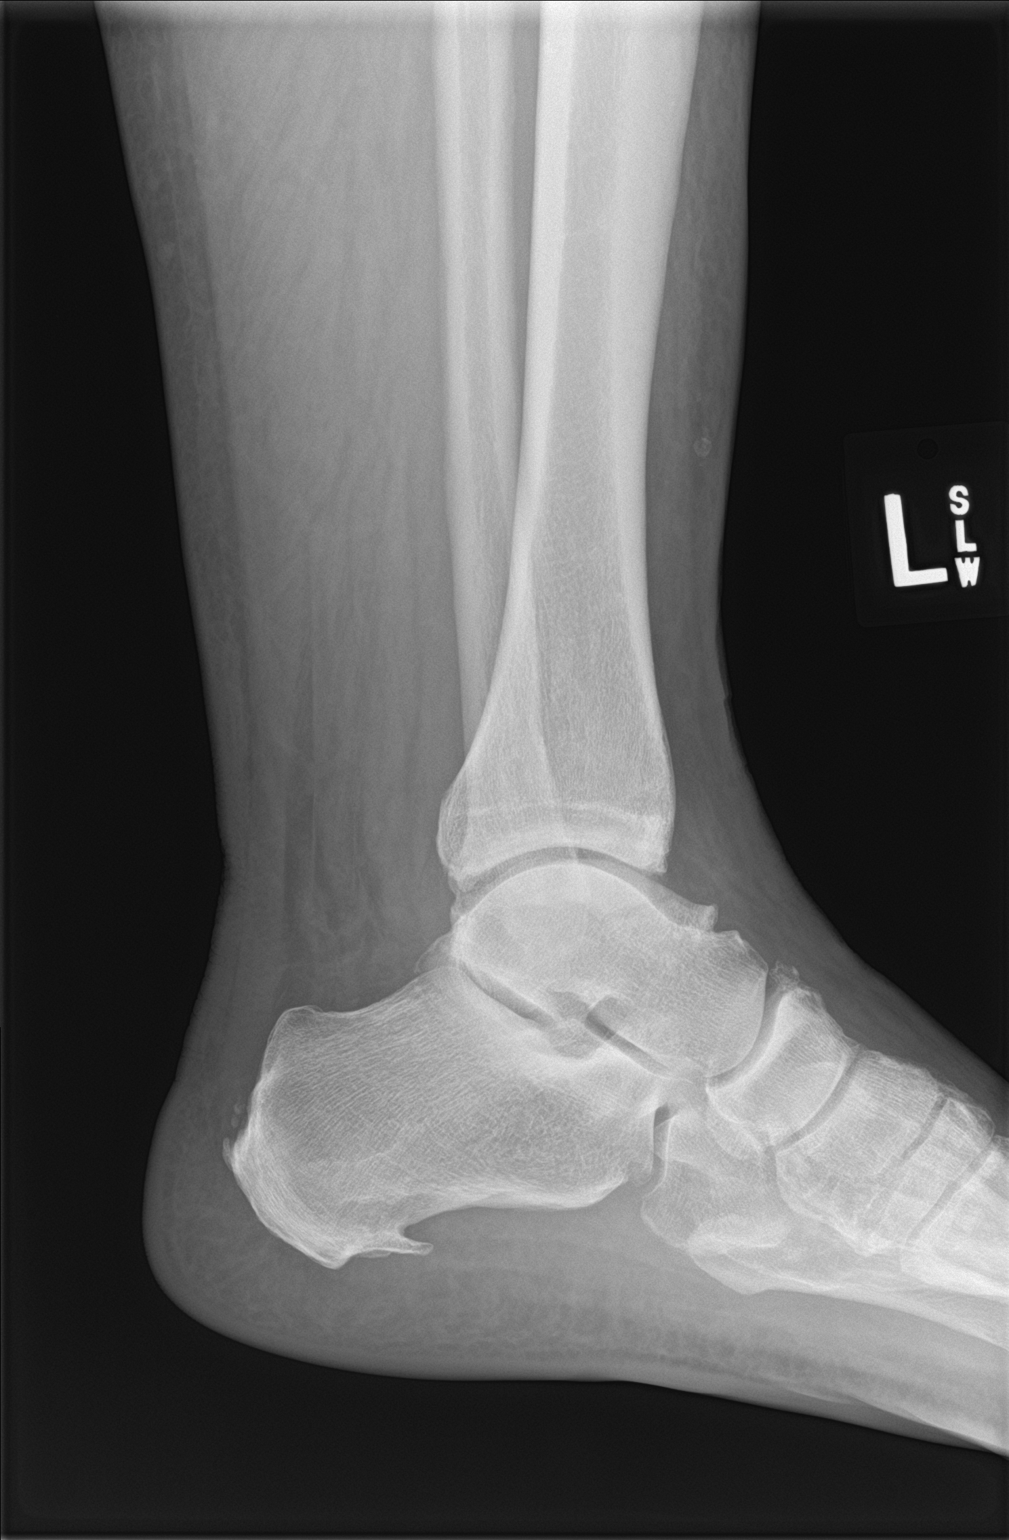

[3 of 3 positions shown; findings below may reference images not displayed]

FINDINGS: Again noted is an avulsion fracture of the tip of the medial
malleolus. There is slightly increased distraction at the fracture
as compared to the prior study. Soft tissue swelling has resolved.
Slight arthritic changes of the ankle joint. Calcaneal
enthesophytes.
IMPRESSION: Avulsion fracture of the tip of the medial malleolus has slightly
increased distraction since the prior exam.

## 2021-02-25 ENCOUNTER — Telehealth: Payer: Self-pay | Admitting: Emergency Medicine

## 2021-02-25 ENCOUNTER — Other Ambulatory Visit: Payer: Self-pay

## 2021-02-25 ENCOUNTER — Emergency Department
Admission: EM | Admit: 2021-02-25 | Discharge: 2021-02-25 | Disposition: A | Discharge status: Home / Self Care | Payer: Medicare Other | Source: Home / Self Care

## 2021-02-25 ENCOUNTER — Emergency Department (INDEPENDENT_AMBULATORY_CARE_PROVIDER_SITE_OTHER): Payer: Medicare Other

## 2021-02-25 DIAGNOSIS — S66912A Strain of unspecified muscle, fascia and tendon at wrist and hand level, left hand, initial encounter: Secondary | ICD-10-CM

## 2021-02-25 DIAGNOSIS — M79642 Pain in left hand: Secondary | ICD-10-CM | POA: Diagnosis not present

## 2021-02-25 DIAGNOSIS — M25532 Pain in left wrist: Secondary | ICD-10-CM

## 2021-02-25 MED ORDER — BACLOFEN 10 MG PO TABS: 10.0000 mg | ORAL_TABLET | Freq: Three times a day (TID) | ORAL | 0 refills | Status: DC

## 2021-02-25 MED ORDER — METHYLPREDNISOLONE 4 MG PO TBPK: ORAL_TABLET | ORAL | 0 refills | Status: DC

## 2021-02-25 NOTE — ED Provider Notes (Signed)
[facility]    CSN: [id number] Arrival date & time: 02/25/21  1545      History   Chief Complaint Chief Complaint  Patient presents with   Wrist Injury    Lt wrist     HPI Matthew Day is a 65 y.o. male.   HPI 65 year old male presents with left injury sustained last night while in a training class at his [employer].  Patient reports heard a pop and immediate swelling, discomfort noted in left wrist.  Past Medical History:  Diagnosis Date   Hypertension    Joint pain     Patient Active Problem List   Diagnosis Date Noted   Fracture of ankle, medial malleolus, left, closed 12/27/2017    Past Surgical History:  Procedure Laterality Date   TONSILLECTOMY         Home Medications    Prior to Admission medications   Medication Sig Start Date End Date Taking? Authorizing Provider  Ascorbic Acid (VITAMIN C) 100 MG tablet Take 100 mg by mouth daily.   Yes [provider]  baclofen (LIORESAL) 10 MG tablet Take 1 tablet (10 mg total) by mouth 3 (three) times daily. 02/25/21  Yes Trevor Iha, FNP  cholecalciferol (VITAMIN D3) 25 MCG (1000 UNIT) tablet Take 1,000 Units by mouth daily.   Yes [provider]  ferrous sulfate 325 (65 FE) MG tablet Take 325 mg by mouth daily with breakfast.   Yes [provider]  methylPREDNISolone (MEDROL DOSEPAK) 4 MG TBPK tablet Take as directed 02/25/21  Yes Trevor Iha, FNP  albuterol (PROVENTIL HFA;VENTOLIN HFA) 108 (90 Base) MCG/ACT inhaler Inhale 1-2 puffs into the lungs every 6 (six) hours as needed for wheezing or shortness of breath. Patient not taking: Reported on 02/25/2021 05/18/18   Eustace Moore, MD  azithromycin (ZITHROMAX) 250 MG tablet Take 1 tablet (250 mg total) by mouth daily. Take first 2 tablets together, then 1 every day until finished. Patient not taking: Reported on 02/25/2021 05/18/18   Eustace Moore, MD  benzonatate (TESSALON) 200 MG capsule Take 1 capsule (200 mg total) by  mouth 2 (two) times daily as needed for cough. Patient not taking: Reported on 02/25/2021 05/18/18   Eustace Moore, MD  Calcium Carbonate-Vitamin D 600-400 MG-UNIT tablet Take 1 tablet by mouth 2 (two) times daily. Patient not taking: Reported on 02/25/2021 12/27/17   Monica Becton, MD  losartan (COZAAR) 25 MG tablet Take 12.5 mg by mouth daily.    [provider]  MULTIPLE VITAMIN PO Take by mouth. Patient not taking: Reported on 02/25/2021    [provider]  predniSONE (DELTASONE) 20 MG tablet Take 1 tablet (20 mg total) by mouth 2 (two) times daily with a meal. Patient not taking: Reported on 02/25/2021 05/18/18   Eustace Moore, MD    Family History Family History  Problem Relation Age of Onset   COPD Father    Heart attack Father     Social History Social History   Tobacco Use   Smoking status: Former   Smokeless tobacco: Never  Building services engineer Use: Never used  Substance Use Topics   Alcohol use: Not Currently   Drug use: Never     Allergies   Penicillins   Review of Systems Review of Systems  Musculoskeletal:        Left wrist pain x 1 day    Physical Exam Triage Vital Signs ED Triage Vitals  Enc Vitals  Group     BP 02/25/21 1600 (!) 148/89     Pulse Rate 02/25/21 1600 67     Resp 02/25/21 1600 12     Temp 02/25/21 1600 98.7 F (37.1 C)     Temp Source 02/25/21 1600 Oral     SpO2 02/25/21 1600 96 %     Weight 02/25/21 1602 252 lb (114.3 kg)     Height 02/25/21 1602 5\' 10"  (1.778 m)     Head Circumference --      Peak Flow --      Pain Score 02/25/21 1602 4     Pain Loc --      Pain Edu? --      Excl. in GC? --    No data found.  Updated Vital Signs BP (!) 148/89 (BP Location: Left Arm)   Pulse 67   Temp 98.7 F (37.1 C) (Oral)   Resp 12   Ht 5\' 10"  (1.778 m)   Wt 252 lb (114.3 kg)   SpO2 96%   BMI 36.16 kg/m      Physical Exam Vitals and nursing note reviewed.  Constitutional:      General: He is  not in acute distress.    Appearance: Normal appearance. He is obese. He is not ill-appearing.  HENT:     Head: Normocephalic and atraumatic.     Mouth/Throat:     Mouth: Mucous membranes are moist.     Pharynx: Oropharynx is clear.  Eyes:     Extraocular Movements: Extraocular movements intact.     Conjunctiva/sclera: Conjunctivae normal.     Pupils: Pupils are equal, round, and reactive to light.  Cardiovascular:     Rate and Rhythm: Normal rate and regular rhythm.     Pulses: Normal pulses.     Heart sounds: Normal heart sounds. No murmur heard. Pulmonary:     Effort: Pulmonary effort is normal.     Breath sounds: Normal breath sounds.     Comments: No adventitious breath sounds noted Musculoskeletal:        General: Swelling, tenderness and signs of injury present. No deformity.     Cervical back: Normal range of motion and neck supple.     Comments: Left wrist (dorsum): TTP over distal radius/ulna, scaphoid, and trapezium, mild pain elicited with flexion/extension/ulnar deviation  Skin:    General: Skin is warm and dry.     Capillary Refill: Capillary refill takes less than 2 seconds.  Neurological:     General: No focal deficit present.     Mental Status: He is alert and oriented to person, place, and time. Mental status is at baseline.  Psychiatric:        Mood and Affect: Mood normal.        Behavior: Behavior normal.        Thought Content: Thought content normal.     UC Treatments / Results  Labs (all labs ordered are listed, but only abnormal results are displayed) Labs Reviewed - No data to display  EKG   Radiology DG Hand Complete Left  Result Date: 02/25/2021 CLINICAL DATA:  Left dorsal hand and wrist pain after injury. EXAM: LEFT HAND - COMPLETE 3+ VIEW COMPARISON:  None. FINDINGS: No acute fracture or dislocation. Severe radiocarpal joint space narrowing. Moderate scaphotrapeziotrapezoid joint space narrowing. Mild osteoarthritis of the thumb IP joint and  second and third DIP joints. Bone mineralization is normal. Soft tissue swelling around the wrist. IMPRESSION: 1. No acute osseous abnormality.  2. Left hand and wrist osteoarthritis as described above. Electronically Signed   By: Obie Dredge M.D.   On: 02/25/2021 17:02    Procedures Procedures (including critical care time)  Medications Ordered in UC Medications - No data to display  Initial Impression / Assessment and Plan / UC Course  I have reviewed the triage vital signs and the nursing notes.  Pertinent labs & imaging results that were available during my care of the patient were reviewed by me and considered in my medical decision making (see chart for details).     MDM: 1.  Left wrist pain-left wrist x-ray reveals no acute osseous abnormality, left hand/wrist osteoarthritis noted; 2.  Left wrist sprain-Rx'd Medrol Dosepak and Baclofen, patient placed in left wrist brace prior to discharge, Advised/instructed patient to start Medrol Dosepak tomorrow, Saturday, 02/26/2021.  Advised patient to wear left wrist brace 24/7 (except when bathing) for the next 10 days.  Advised/encouraged patient to RICE left wrist for 25 minutes 2-3 times daily for the next 3 days.  Advised patient to avoid moderate to strenuous activities including repetitive motion activities involving left hand and left wrist for the next 7 to 10 days.  Patient discharged home, hemodynamically stable Final Clinical Impressions(s) / UC Diagnoses   Final diagnoses:  Left wrist pain  Muscle strain of left wrist, initial encounter     Discharge Instructions      Advised/instructed patient to start Medrol Dosepak tomorrow, Saturday, 02/26/2021.  Advised patient to wear left wrist brace 24/7 (except when bathing) for the next 10 days.  Advised/encouraged patient to RICE left wrist for 25 minutes 2-3 times daily for the next 3 days.  Advised patient to avoid moderate to strenuous activities including repetitive motion  activities involving left hand and left wrist for the next 7 to 10 days.      ED Prescriptions     Medication Sig Dispense Auth. Provider   methylPREDNISolone (MEDROL DOSEPAK) 4 MG TBPK tablet Take as directed 1 each Trevor Iha, FNP   baclofen (LIORESAL) 10 MG tablet Take 1 tablet (10 mg total) by mouth 3 (three) times daily. 30 each Trevor Iha, FNP      PDMP not reviewed this encounter.   Trevor Iha, FNP 02/25/21 1736

## 2021-02-25 NOTE — Telephone Encounter (Signed)
Pt here in person - return to check on medrol dose pack - escript not received per pharmacy - script called into to [facility] - pt updated

## 2021-02-25 NOTE — ED Triage Notes (Signed)
Pt seen in UC w/ c/o left wrist injury that occurred during a training class. Pt states he heard a pop and has had swelling and discomfort since.

## 2021-02-25 NOTE — Discharge Instructions (Addendum)
Advised/instructed patient to start Medrol Dosepak tomorrow, Saturday, 02/26/2021.  Advised patient to wear left wrist brace 24/7 (except when bathing) for the next 10 days.  Advised/encouraged patient to RICE left wrist for 25 minutes 2-3 times daily for the next 3 days.  Advised patient to avoid moderate to strenuous activities including repetitive motion activities involving left hand and left wrist for the next 7 to 10 days.

## 2021-12-30 ENCOUNTER — Emergency Department
Admission: EM | Admit: 2021-12-30 | Discharge: 2021-12-30 | Disposition: A | Discharge status: Home / Self Care | Payer: Medicare Other | Attending: Family Medicine | Admitting: Family Medicine

## 2021-12-30 ENCOUNTER — Encounter: Payer: Self-pay | Admitting: Emergency Medicine

## 2021-12-30 DIAGNOSIS — B029 Zoster without complications: Secondary | ICD-10-CM | POA: Diagnosis not present

## 2021-12-30 MED ORDER — VALACYCLOVIR HCL 1 G PO TABS: 1000.0000 mg | ORAL_TABLET | Freq: Three times a day (TID) | ORAL | 0 refills | Status: AC

## 2021-12-30 NOTE — Discharge Instructions (Addendum)
Instructed patient to take medication as directed with food to completion.  Advised patient to take Valtrex 3 times today every 4 hours, then tomorrow for the next 6 days please take at 7 AM, 11 AM, and 3 PM until complete.  Encourage patient increase daily water intake while taking this medication.  Advised patient if symptoms worsen and/or unresolved please follow-up with PCP or here for further evaluation.

## 2021-12-30 NOTE — ED Provider Notes (Signed)
[name]    CSN: [id number] Arrival date & time: 12/30/21  0851      History   Chief Complaint Chief Complaint  Patient presents with   Rash    HPI Matthew Day is a 66 y.o. male.   HPI Pleasant 66 year old male presents with a rash on his neck for 1 week that is spread to his back and arms.  Reports rash is painful and not pruritic.  Past Medical History:  Diagnosis Date   Hypertension    Joint pain     Patient Active Problem List   Diagnosis Date Noted   Fracture of ankle, medial malleolus, left, closed 12/27/2017    Past Surgical History:  Procedure Laterality Date   TONSILLECTOMY         Home Medications    Prior to Admission medications   Medication Sig Start Date End Date Taking? Authorizing Provider  valACYclovir (VALTREX) 1000 MG tablet Take 1 tablet (1,000 mg total) by mouth 3 (three) times daily for 7 days. 12/30/21 01/06/22 Yes Trevor Iha, FNP  albuterol (PROVENTIL HFA;VENTOLIN HFA) 108 (90 Base) MCG/ACT inhaler Inhale 1-2 puffs into the lungs every 6 (six) hours as needed for wheezing or shortness of breath. Patient not taking: Reported on 02/25/2021 05/18/18   Eustace Moore, MD  Ascorbic Acid (VITAMIN C) 100 MG tablet Take 100 mg by mouth daily.    [provider]  azithromycin (ZITHROMAX) 250 MG tablet Take 1 tablet (250 mg total) by mouth daily. Take first 2 tablets together, then 1 every day until finished. Patient not taking: Reported on 02/25/2021 05/18/18   Eustace Moore, MD  Calcium Carbonate-Vitamin D 600-400 MG-UNIT tablet Take 1 tablet by mouth 2 (two) times daily. Patient not taking: Reported on 02/25/2021 12/27/17   Monica Becton, MD  cholecalciferol (VITAMIN D3) 25 MCG (1000 UNIT) tablet Take 1,000 Units by mouth daily.    [provider]  losartan (COZAAR) 25 MG tablet Take 12.5 mg by mouth daily.    [provider]  MULTIPLE VITAMIN PO Take by mouth. Patient not taking:  Reported on 02/25/2021    [provider]    Family History Family History  Problem Relation Age of Onset   COPD Father    Heart attack Father     Social History Social History   Tobacco Use   Smoking status: Former   Smokeless tobacco: Never  Building services engineer Use: Never used  Substance Use Topics   Alcohol use: Not Currently   Drug use: Never     Allergies   Penicillins   Review of Systems Review of Systems  Skin:  Positive for rash.     Physical Exam Triage Vital Signs ED Triage Vitals  Enc Vitals Group     BP      Pulse      Resp      Temp      Temp src      SpO2      Weight      Height      Head Circumference      Peak Flow      Pain Score      Pain Loc      Pain Edu?      Excl. in GC?    No data found.  Updated Vital Signs BP (!) 146/89 (BP Location: Right Arm)   Pulse 66   Temp 97.9 F (36.6 C) (Oral)  Resp 18   SpO2 96%      Physical Exam Vitals and nursing note reviewed.  Constitutional:      Appearance: Normal appearance. He is obese.  HENT:     Head: Normocephalic and atraumatic.     Mouth/Throat:     Mouth: Mucous membranes are moist.     Pharynx: Oropharynx is clear.  Eyes:     Extraocular Movements: Extraocular movements intact.     Conjunctiva/sclera: Conjunctivae normal.     Pupils: Pupils are equal, round, and reactive to light.  Cardiovascular:     Rate and Rhythm: Normal rate and regular rhythm.     Pulses: Normal pulses.     Heart sounds: Normal heart sounds. No murmur heard. Pulmonary:     Effort: Pulmonary effort is normal.     Breath sounds: Normal breath sounds. No wheezing, rhonchi or rales.  Musculoskeletal:     Cervical back: Normal range of motion and neck supple.  Skin:    General: Skin is warm and dry.     Comments: Upper back/upper left arm: Erythematous painful grouped vesicular lesions, with mild crusting of borders noted, please see images insert below  Neurological:     General: No  focal deficit present.     Mental Status: He is alert and oriented to person, place, and time.           UC Treatments / Results  Labs (all labs ordered are listed, but only abnormal results are displayed) Labs Reviewed - No data to display  EKG   Radiology No results found.  Procedures Procedures (including critical care time)  Medications Ordered in UC Medications - No data to display  Initial Impression / Assessment and Plan / UC Course  I have reviewed the triage vital signs and the nursing notes.  Pertinent labs & imaging results that were available during my care of the patient were reviewed by me and considered in my medical decision making (see chart for details).     MDM: 1.  Herpes zoster without complication-Rx'd Valtrex. Instructed patient to take medication as directed with food to completion.  Advised patient to take Valtrex 3 times today every 4 hours, then tomorrow for the next 6 days please take at 7 AM, 11 AM, and 3 PM until complete.  Encourage patient increase daily water intake while taking this medication.  Advised patient if symptoms worsen and/or unresolved please follow-up with PCP or here for further evaluation.  Patient discharged home, hemodynamically stable.  Final Clinical Impressions(s) / UC Diagnoses   Final diagnoses:  Herpes zoster without complication     Discharge Instructions      Instructed patient to take medication as directed with food to completion.  Advised patient to take Valtrex 3 times today every 4 hours, then tomorrow for the next 6 days please take at 7 AM, 11 AM, and 3 PM until complete.  Encourage patient increase daily water intake while taking this medication.  Advised patient if symptoms worsen and/or unresolved please follow-up with PCP or here for further evaluation.     ED Prescriptions     Medication Sig Dispense Auth. Provider   valACYclovir (VALTREX) 1000 MG tablet Take 1 tablet (1,000 mg total) by  mouth 3 (three) times daily for 7 days. 21 tablet Trevor Iha, FNP      PDMP not reviewed this encounter.   Trevor Iha, FNP 12/30/21 [phone]

## 2021-12-30 NOTE — ED Triage Notes (Signed)
Pt states he noticed a rash on his neck about 1 week ago that has spread to his back and arms. States rash is painful but denies itching. No meds/creams used at home.

## 2021-12-31 ENCOUNTER — Telehealth: Payer: Self-pay

## 2021-12-31 NOTE — Telephone Encounter (Signed)
Attempted to call pt to f/u after yesterday's visit to [facility]. No answer. VM left to f/u if there are any questions/concerns.

## 2022-01-16 ENCOUNTER — Emergency Department
Admission: EM | Admit: 2022-01-16 | Discharge: 2022-01-16 | Disposition: A | Discharge status: Home / Self Care | Payer: Medicare Other | Attending: Family Medicine | Admitting: Family Medicine

## 2022-01-16 DIAGNOSIS — R21 Rash and other nonspecific skin eruption: Secondary | ICD-10-CM

## 2022-01-16 DIAGNOSIS — M542 Cervicalgia: Secondary | ICD-10-CM

## 2022-01-16 DIAGNOSIS — R030 Elevated blood-pressure reading, without diagnosis of hypertension: Secondary | ICD-10-CM

## 2022-01-16 MED ORDER — IBUPROFEN 800 MG PO TABS: 800.0000 mg | ORAL_TABLET | Freq: Three times a day (TID) | ORAL | 0 refills | Status: DC

## 2022-01-16 MED ORDER — VALACYCLOVIR HCL 1 G PO TABS: 1000.0000 mg | ORAL_TABLET | Freq: Three times a day (TID) | ORAL | 0 refills | Status: DC

## 2022-01-16 MED ORDER — BACLOFEN 10 MG PO TABS: 10.0000 mg | ORAL_TABLET | Freq: Three times a day (TID) | ORAL | 0 refills | Status: DC

## 2022-01-16 NOTE — Discharge Instructions (Addendum)
Take the valacyclovir 3 x a day Tale ibuprofen 3 x a day with food Take the baclofen as muscle relaxer.  May take 3 x a day.  Take 2 at bedtime.  May cause drowsiness Gentle massage may help muscle  Follow-up with your primary care doctor regarding your blood pressure

## 2022-01-16 NOTE — ED Provider Notes (Signed)
[facility]    CSN: [id number] Arrival date & time: 01/16/22  4650      History   Chief Complaint Chief Complaint  Patient presents with   Rash    HPI Matthew Day is a 66 y.o. male.   HPI  Patient is here for 2 medical problems.  First he has pain in his left shoulder.  Is been present for about a week.  Its not responding to home care and massage.  He had no accident or injury, he woke up with it. Second problem is a rash.  He has areas where he develops pain, redness, and then blisters pop up.  He was seen for this earlier this month and was given acyclovir.  He states "it cleared right up".  He is requesting refill of acyclovir.  Rash he currently has is on his right inner bicep and his right groin.  This is not the normal distribution for herpes zoster.  Nor is it usual for herpes simplex.  I am uncertain why valacyclovir helped, I looked at the pictures from his last visit and it is not clearly a shingles typical rash. He has multiple visits in the past for various dermatitis.  Seen in his primary care records.  He has clobetasol but has not used it for his current rash    Past Medical History:  Diagnosis Date   Hypertension    Joint pain     Patient Active Problem List   Diagnosis Date Noted   Fracture of ankle, medial malleolus, left, closed 12/27/2017    Past Surgical History:  Procedure Laterality Date   TONSILLECTOMY         Home Medications    Prior to Admission medications   Medication Sig Start Date End Date Taking? Authorizing Provider  baclofen (LIORESAL) 10 MG tablet Take 1-2 tablets (10-20 mg total) by mouth 3 (three) times daily. 01/16/22  Yes Eustace Moore, MD  ibuprofen (ADVIL) 800 MG tablet Take 1 tablet (800 mg total) by mouth 3 (three) times daily. 01/16/22  Yes Eustace Moore, MD  valACYclovir (VALTREX) 1000 MG tablet Take 1 tablet (1,000 mg total) by mouth 3 (three) times daily. 01/16/22  Yes Eustace Moore, MD   Ascorbic Acid (VITAMIN C) 100 MG tablet Take 100 mg by mouth daily.    [provider]  cholecalciferol (VITAMIN D3) 25 MCG (1000 UNIT) tablet Take 1,000 Units by mouth daily.    [provider]  losartan (COZAAR) 25 MG tablet Take 12.5 mg by mouth daily.    [provider]    Family History Family History  Problem Relation Age of Onset   COPD Father    Heart attack Father     Social History Social History   Tobacco Use   Smoking status: Former   Smokeless tobacco: Never  Building services engineer Use: Never used  Substance Use Topics   Alcohol use: Not Currently   Drug use: Never     Allergies   Penicillins   Review of Systems Review of Systems See HPI  Physical Exam Triage Vital Signs ED Triage Vitals  Enc Vitals Group     BP 01/16/22 0824 (!) 168/94     Pulse Rate 01/16/22 0824 63     Resp 01/16/22 0824 16     Temp 01/16/22 0824 98.8 F (37.1 C)     Temp src --      SpO2 01/16/22 0824 98 %  Weight --      Height --      Head Circumference --      Peak Flow --      Pain Score 01/16/22 0823 7     Pain Loc --      Pain Edu? --      Excl. in GC? --    No data found.  Updated Vital Signs BP (!) 168/94 (BP Location: Left Arm)   Pulse 63   Temp 98.8 F (37.1 C)   Resp 16   SpO2 98%      Physical Exam Constitutional:      General: He is not in acute distress.    Appearance: Normal appearance. He is well-developed.     Comments: Muscular   HENT:     Head: Normocephalic and atraumatic.  Eyes:     Conjunctiva/sclera: Conjunctivae normal.     Pupils: Pupils are equal, round, and reactive to light.  Cardiovascular:     Rate and Rhythm: Normal rate.  Pulmonary:     Effort: Pulmonary effort is normal. No respiratory distress.  Abdominal:     General: There is no distension.     Palpations: Abdomen is soft.  Musculoskeletal:        General: Tenderness present. Normal range of motion.       Arms:     Cervical back:  Normal range of motion.  Skin:    General: Skin is warm and dry.     Findings: Rash present.     Comments: On the right bicep there is a 6 cm area of and inflammation with 2 vesicles centrally.  In the right groin there is a 1-1/2 x 3 cm area of erythema and warmth with no vesicles.  Patient states this area will likely break out in vesicles if it follows the usual pattern  Neurological:     Mental Status: He is alert.      UC Treatments / Results  Labs (all labs ordered are listed, but only abnormal results are displayed) Labs Reviewed - No data to display  EKG   Radiology No results found.  Procedures Procedures (including critical care time)  Medications Ordered in UC Medications - No data to display  Initial Impression / Assessment and Plan / UC Course  I have reviewed the triage vital signs and the nursing notes.  Pertinent labs & imaging results that were available during my care of the patient were reviewed by me and considered in my medical decision making (see chart for details).     Unclear causation of the rash.  He states it is just like his last rash that cleared with valacyclovir.  He is requesting refill of acyclovir and I will provide this, however recommend he see his primary care doctor in follow-up.  Discussed shingles shots.  Recheck blood pressure. Muscle spasm will be treated with anti-inflammatories and muscle relaxer Final Clinical Impressions(s) / UC Diagnoses   Final diagnoses:  Rash and nonspecific skin eruption  Musculoskeletal neck pain  Elevated blood pressure reading     Discharge Instructions      Take the valacyclovir 3 x a day Tale ibuprofen 3 x a day with food Take the baclofen as muscle relaxer.  May take 3 x a day.  Take 2 at bedtime.  May cause drowsiness Gentle massage may help muscle  Follow-up with your primary care doctor regarding your blood pressure    ED Prescriptions     Medication Sig Dispense  Auth. Provider    valACYclovir (VALTREX) 1000 MG tablet Take 1 tablet (1,000 mg total) by mouth 3 (three) times daily. 21 tablet Eustace Moore, MD   baclofen (LIORESAL) 10 MG tablet Take 1-2 tablets (10-20 mg total) by mouth 3 (three) times daily. 30 each Eustace Moore, MD   ibuprofen (ADVIL) 800 MG tablet Take 1 tablet (800 mg total) by mouth 3 (three) times daily. 21 tablet Eustace Moore, MD      PDMP not reviewed this encounter.   Eustace Moore, MD 01/16/22 [phone]

## 2022-01-16 NOTE — ED Triage Notes (Signed)
Pt presents with c/o rash on arm, back, and groin. Pt states rash began Saturday and he has previously been seen for similar rash. Pt also states he has left shoulder pain for "a couple of weeks".

## 2022-01-17 ENCOUNTER — Telehealth: Payer: Self-pay

## 2022-01-17 NOTE — Telephone Encounter (Signed)
TCT pt following up from recent visit. HIPAA compliant VM left for return call.  

## 2022-01-19 ENCOUNTER — Telehealth: Payer: Self-pay

## 2022-01-19 NOTE — Telephone Encounter (Signed)
Pt called about continued rash. States its continuing to spread to both sides of body. Valacyclovir not helping this time. Per michael, can try clobetasol cream for 7 days and follow up if still not improvement for further eval. Pt acknowledged.

## 2023-06-06 ENCOUNTER — Ambulatory Visit: Payer: Medicare Other

## 2023-06-06 ENCOUNTER — Ambulatory Visit
Admission: EM | Admit: 2023-06-06 | Discharge: 2023-06-06 | Disposition: A | Discharge status: Home / Self Care | Payer: Medicare Other | Attending: Family Medicine | Admitting: Family Medicine

## 2023-06-06 ENCOUNTER — Telehealth: Payer: Self-pay

## 2023-06-06 DIAGNOSIS — R0781 Pleurodynia: Secondary | ICD-10-CM

## 2023-06-06 DIAGNOSIS — S20211A Contusion of right front wall of thorax, initial encounter: Secondary | ICD-10-CM

## 2023-06-06 MED ORDER — OXYCODONE-ACETAMINOPHEN 5-325 MG PO TABS: 1.0000 | ORAL_TABLET | Freq: Three times a day (TID) | ORAL | 0 refills | Status: DC | PRN

## 2023-06-06 MED ORDER — CELECOXIB 200 MG PO CAPS: 200.0000 mg | ORAL_CAPSULE | Freq: Every day | ORAL | 0 refills | Status: AC

## 2023-06-06 NOTE — Telephone Encounter (Signed)
Call made to pt re neg cxr. Per Trevor Iha, FNP no evidence of fx. Can take celebrex for any discomfort and f/u with PCP in 102 weeks if no improvement.

## 2023-06-06 NOTE — ED Triage Notes (Signed)
Pt c/o RT rib pain x 10 days. Says he had a fall injuring his RT ribs.

## 2023-06-06 NOTE — Discharge Instructions (Addendum)
Patient to take medication as directed with food to completion.  Advised patient may take Percocet for acute/severe breakthrough right rib pain.  Patient advised of sedative effects of this medication.  Encouraged to increase daily water intake to 64 ounces per day while taking these medications.  Advised if symptoms worsen and/or unresolved please follow-up PCP or here for further evaluation.  Patient advised of chest x-ray results via telephone prior to closing this chart this evening.

## 2023-06-06 NOTE — ED Provider Notes (Signed)
[name]    CSN: [id number] Arrival date & time: 06/06/23  1540      History   Chief Complaint Chief Complaint  Patient presents with   Rib Injury    RT    HPI Matthew Day is a 67 y.o. male.   HPI 67 year old male presents with right rib pain for 10 days reports falling onto his right rib while at home in his garage on 05/27/2023.  PMH significant for morbid obesity and HTN.  Past Medical History:  Diagnosis Date   Hypertension    Joint pain     Patient Active Problem List   Diagnosis Date Noted   Fracture of ankle, medial malleolus, left, closed 12/27/2017    Past Surgical History:  Procedure Laterality Date   TONSILLECTOMY         Home Medications    Prior to Admission medications   Medication Sig Start Date End Date Taking? Authorizing Provider  celecoxib (CELEBREX) 200 MG capsule Take 1 capsule (200 mg total) by mouth daily for 15 days. 06/06/23 06/21/23 Yes Trevor Iha, FNP  oxyCODONE-acetaminophen (PERCOCET/ROXICET) 5-325 MG tablet Take 1 tablet by mouth every 8 (eight) hours as needed for severe pain (pain score 7-10). 06/06/23  Yes Trevor Iha, FNP  Ascorbic Acid (VITAMIN C) 100 MG tablet Take 100 mg by mouth daily.    [provider]  baclofen (LIORESAL) 10 MG tablet Take 1-2 tablets (10-20 mg total) by mouth 3 (three) times daily. 01/16/22   Eustace Moore, MD  cholecalciferol (VITAMIN D3) 25 MCG (1000 UNIT) tablet Take 1,000 Units by mouth daily.    [provider]  ibuprofen (ADVIL) 800 MG tablet Take 1 tablet (800 mg total) by mouth 3 (three) times daily. 01/16/22   Eustace Moore, MD  losartan (COZAAR) 25 MG tablet Take 12.5 mg by mouth daily.    [provider]  valACYclovir (VALTREX) 1000 MG tablet Take 1 tablet (1,000 mg total) by mouth 3 (three) times daily. 01/16/22   Eustace Moore, MD    Family History Family History  Problem Relation Age of Onset   COPD Father    Heart attack  Father     Social History Social History   Tobacco Use   Smoking status: Former   Smokeless tobacco: Never  Vaping Use   Vaping status: Never Used  Substance Use Topics   Alcohol use: Not Currently   Drug use: Never     Allergies   Penicillins   Review of Systems Review of Systems  Musculoskeletal:        Right rib pain     Physical Exam Triage Vital Signs ED Triage Vitals  Encounter Vitals Group     BP 06/06/23 1552 (!) 158/78     Systolic BP Percentile --      Diastolic BP Percentile --      Pulse Rate 06/06/23 1552 81     Resp 06/06/23 1552 17     Temp 06/06/23 1552 98.4 F (36.9 C)     Temp Source 06/06/23 1552 Oral     SpO2 06/06/23 1552 97 %     Weight --      Height --      Head Circumference --      Peak Flow --      Pain Score 06/06/23 1553 0     Pain Loc --      Pain Education --      Exclude from  Growth Chart --    No data found.  Updated Vital Signs BP (!) 158/78 (BP Location: Right Arm)   Pulse 81   Temp 98.4 F (36.9 C) (Oral)   Resp 17   SpO2 97%   Visual Acuity Right Eye Distance:   Left Eye Distance:   Bilateral Distance:    Right Eye Near:   Left Eye Near:    Bilateral Near:     Physical Exam Vitals and nursing note reviewed.  Constitutional:      Appearance: Normal appearance. He is normal weight.  HENT:     Head: Normocephalic and atraumatic.     Mouth/Throat:     Mouth: Mucous membranes are moist.     Pharynx: Oropharynx is clear.  Eyes:     Extraocular Movements: Extraocular movements intact.     Conjunctiva/sclera: Conjunctivae normal.     Pupils: Pupils are equal, round, and reactive to light.  Cardiovascular:     Rate and Rhythm: Normal rate and regular rhythm.     Pulses: Normal pulses.     Heart sounds: Normal heart sounds.  Pulmonary:     Effort: Pulmonary effort is normal.     Breath sounds: Normal breath sounds. No wheezing, rhonchi or rales.  Musculoskeletal:        General: Normal range of  motion.     Cervical back: Normal range of motion and neck supple.     Comments: Patient reporting pain and tenderness over right lateral/posterior inferior ribs, no soft tissue swelling no bruising no ecchymosis noted  Skin:    General: Skin is warm and dry.  Neurological:     General: No focal deficit present.     Mental Status: He is alert and oriented to person, place, and time. Mental status is at baseline.  Psychiatric:        Mood and Affect: Mood normal.        Behavior: Behavior normal.      UC Treatments / Results  Labs (all labs ordered are listed, but only abnormal results are displayed) Labs Reviewed - No data to display  EKG   Radiology DG Ribs Unilateral W/Chest Right Result Date: 06/06/2023 CLINICAL DATA:  Fall at home 10 days ago.  Right posterior rib pain. EXAM: RIGHT RIBS AND CHEST - 3+ VIEW COMPARISON:  None available. FINDINGS: The heart size and mediastinal contours are within normal limits. Mild atelectasis at both lung bases. No pleural effusion or pneumothorax. No evidence of acute right-sided rib fracture or focal rib lesion. There are degenerative changes throughout the visualized spine. IMPRESSION: No evidence of acute right-sided rib fracture or pneumothorax. Mild bibasilar atelectasis. Electronically Signed   By: Carey Bullocks M.D.   On: 06/06/2023 17:14    Procedures Procedures (including critical care time)  Medications Ordered in UC Medications - No data to display  Initial Impression / Assessment and Plan / UC Course  I have reviewed the triage vital signs and the nursing notes.  Pertinent labs & imaging results that were available during my care of the patient were reviewed by me and considered in my medical decision making (see chart for details).     MDM: 1.  Rib pain on right side-right rib with chest x-ray revealed above, Rx'd Percocet 5/325 mg tablet: Take 1 tablet every 8 hours as needed for severe right rib pain; 2.  Rib contusion,  right, initial encounter-Rx'd Celebrex 200 mg capsule: Take 1 capsule daily x 15 days. Patient to take  medication as directed with food to completion.  Advised patient may take Percocet for acute/severe breakthrough right rib pain.  Patient advised of sedative effects of this medication.  Encouraged to increase daily water intake to 64 ounces per day while taking these medications.  Advised if symptoms worsen and/or unresolved please follow-up PCP or here for further evaluation.  Patient advised of chest x-ray results via telephone prior to closing this chart this evening.  Final Clinical Impressions(s) / UC Diagnoses   Final diagnoses:  Rib pain on right side  Rib contusion, right, initial encounter     Discharge Instructions      Patient to take medication as directed with food to completion.  Advised patient may take Percocet for acute/severe breakthrough right rib pain.  Patient advised of sedative effects of this medication.  Encouraged to increase daily water intake to 64 ounces per day while taking these medications.  Advised if symptoms worsen and/or unresolved please follow-up PCP or here for further evaluation.  Patient advised of chest x-ray results via telephone prior to closing this chart this evening.     ED Prescriptions     Medication Sig Dispense Auth. Provider   celecoxib (CELEBREX) 200 MG capsule Take 1 capsule (200 mg total) by mouth daily for 15 days. 15 capsule Trevor Iha, FNP   oxyCODONE-acetaminophen (PERCOCET/ROXICET) 5-325 MG tablet Take 1 tablet by mouth every 8 (eight) hours as needed for severe pain (pain score 7-10). 15 tablet Trevor Iha, FNP      I have reviewed the PDMP during this encounter.   Trevor Iha, FNP 06/06/23 1746

## 2023-09-06 ENCOUNTER — Ambulatory Visit
Admission: EM | Admit: 2023-09-06 | Discharge: 2023-09-06 | Disposition: A | Discharge status: Home / Self Care | Attending: Family Medicine | Admitting: Family Medicine

## 2023-09-06 ENCOUNTER — Other Ambulatory Visit: Payer: Self-pay

## 2023-09-06 DIAGNOSIS — J069 Acute upper respiratory infection, unspecified: Secondary | ICD-10-CM | POA: Diagnosis not present

## 2023-09-06 MED ORDER — PREDNISONE 50 MG PO TABS: ORAL_TABLET | ORAL | 0 refills | Status: DC

## 2023-09-06 MED ORDER — AZITHROMYCIN 250 MG PO TABS: ORAL_TABLET | ORAL | 0 refills | Status: DC

## 2023-09-06 MED ORDER — DM-GUAIFENESIN ER 30-600 MG PO TB12: 1.0000 | ORAL_TABLET | Freq: Two times a day (BID) | ORAL | 0 refills | Status: DC

## 2023-09-06 NOTE — ED Triage Notes (Signed)
 Has c/o cough, runny nose, head congestion, watery eyes since yesterday. Unsure of fever. No otc medications.

## 2023-09-06 NOTE — Discharge Instructions (Signed)
 You have an upper respiratory infection.  I believe this is caused by a virus. For now take Mucinex DM for the cough, prednisone for the congestion Drink lots of fluids If you fail to improve over the next few days, or if your symptoms worsen take the antibiotic See your doctor if not improving by next week

## 2023-09-06 NOTE — ED Provider Notes (Signed)
 [facility]    CSN: [id number] Arrival date & time: 09/06/23  0844      History   Chief Complaint Chief Complaint  Patient presents with   Cough    HPI Matthew Day is a 68 y.o. male.   HPI  Matthew Day is a patient known to me from prior visit.  He is a pleasant 68 year old gentleman with well-controlled hypertension.  He is here with his disabled son.  His son is being treated for an upper respiratory infection.  Wife has similar symptoms that required a doctor's visit.  He has started to come down with similar nasal congestion, cough, fatigue, postnasal drip.  He states it feels like a "bad cold".  Both son and wife have required antibiotics to clear their infection.  He has just developing symptoms for the last couple of days.  Non-smoker.  No underlying lung disease or allergies  Past Medical History:  Diagnosis Date   Hypertension    Joint pain     Patient Active Problem List   Diagnosis Date Noted   Fracture of ankle, medial malleolus, left, closed 12/27/2017    Past Surgical History:  Procedure Laterality Date   TONSILLECTOMY         Home Medications    Prior to Admission medications   Medication Sig Start Date End Date Taking? Authorizing Provider  azithromycin (ZITHROMAX Z-PAK) 250 MG tablet Take two pills today followed by one a day until gone 09/06/23  Yes Eustace Moore, MD  dextromethorphan-guaiFENesin [facility] DM) 30-600 MG 12hr tablet Take 1 tablet by mouth 2 (two) times daily. 09/06/23  Yes Eustace Moore, MD  predniSONE (DELTASONE) 50 MG tablet Take once a day for 5 days.  Take with food 09/06/23  Yes Eustace Moore, MD  Ascorbic Acid (VITAMIN C) 100 MG tablet Take 100 mg by mouth daily.    [provider]  cholecalciferol (VITAMIN D3) 25 MCG (1000 UNIT) tablet Take 1,000 Units by mouth daily.    [provider]  losartan (COZAAR) 25 MG tablet Take 12.5 mg by mouth daily.    [provider]     Family History Family History  Problem Relation Age of Onset   COPD Father    Heart attack Father     Social History Social History   Tobacco Use   Smoking status: Former   Smokeless tobacco: Never  Vaping Use   Vaping status: Never Used  Substance Use Topics   Alcohol use: Not Currently   Drug use: Never     Allergies   Penicillins   Review of Systems Review of Systems  See HPI Physical Exam Triage Vital Signs ED Triage Vitals  Encounter Vitals Group     BP 09/06/23 0903 (!) 159/90     Systolic BP Percentile --      Diastolic BP Percentile --      Pulse Rate 09/06/23 0903 69     Resp 09/06/23 0903 16     Temp 09/06/23 0903 98.6 F (37 C)     Temp src --      SpO2 09/06/23 0903 96 %     Weight --      Height --      Head Circumference --      Peak Flow --      Pain Score 09/06/23 0905 0     Pain Loc --      Pain Education --  Exclude from Growth Chart --    No data found.  Updated Vital Signs BP (!) 159/90   Pulse 69   Temp 98.6 F (37 C)   Resp 16   SpO2 96%       Physical Exam Constitutional:      General: He is not in acute distress.    Appearance: He is well-developed. He is obese. He is ill-appearing.  HENT:     Head: Normocephalic and atraumatic.     Right Ear: Tympanic membrane normal.     Left Ear: Tympanic membrane normal.     Nose: Congestion and rhinorrhea present.     Mouth/Throat:     Pharynx: No posterior oropharyngeal erythema.  Eyes:     Conjunctiva/sclera: Conjunctivae normal.     Pupils: Pupils are equal, round, and reactive to light.  Cardiovascular:     Rate and Rhythm: Normal rate and regular rhythm.     Heart sounds: Normal heart sounds.  Pulmonary:     Effort: Pulmonary effort is normal. No respiratory distress.     Breath sounds: Rhonchi present.  Abdominal:     General: There is no distension.     Palpations: Abdomen is soft.  Musculoskeletal:        General: Normal range of motion.     Cervical  back: Normal range of motion.  Lymphadenopathy:     Cervical: No cervical adenopathy.  Skin:    General: Skin is warm and dry.  Neurological:     Mental Status: He is alert.      UC Treatments / Results  Labs (all labs ordered are listed, but only abnormal results are displayed) Labs Reviewed - No data to display  EKG   Radiology No results found.  Procedures Procedures (including critical care time)  Medications Ordered in UC Medications - No data to display  Initial Impression / Assessment and Plan / UC Course  I have reviewed the triage vital signs and the nursing notes.  Pertinent labs & imaging results that were available during my care of the patient were reviewed by me and considered in my medical decision making (see chart for details).     Explained the patient likely has a viral URI.  No indication for antibiotics at this point.  Since both his wife and son have had persistent symptoms that required antibiotics I did give her a written prescription for a Z-Pak to fill and take if he fails to improve by 7 to 10 days. Final Clinical Impressions(s) / UC Diagnoses   Final diagnoses:  Viral URI with cough     Discharge Instructions      You have an upper respiratory infection.  I believe this is caused by a virus. For now take Mucinex DM for the cough, prednisone for the congestion Drink lots of fluids If you fail to improve over the next few days, or if your symptoms worsen take the antibiotic See your doctor if not improving by next week   ED Prescriptions     Medication Sig Dispense Auth. Provider   predniSONE (DELTASONE) 50 MG tablet Take once a day for 5 days.  Take with food 5 tablet Eustace Moore, MD   dextromethorphan-guaiFENesin [facility] DM) 30-600 MG 12hr tablet Take 1 tablet by mouth 2 (two) times daily. 20 tablet Eustace Moore, MD   azithromycin (ZITHROMAX Z-PAK) 250 MG tablet Take two pills today followed by one a day until gone 6  tablet Eustace Moore,  MD      PDMP not reviewed this encounter.   Eustace Moore, MD 09/06/23 [phone]

## 2023-12-11 ENCOUNTER — Ambulatory Visit
Admission: EM | Admit: 2023-12-11 | Discharge: 2023-12-11 | Disposition: A | Discharge status: Home / Self Care | Attending: Family Medicine | Admitting: Family Medicine

## 2023-12-11 DIAGNOSIS — R21 Rash and other nonspecific skin eruption: Secondary | ICD-10-CM | POA: Diagnosis not present

## 2023-12-11 DIAGNOSIS — L237 Allergic contact dermatitis due to plants, except food: Secondary | ICD-10-CM

## 2023-12-11 MED ORDER — PREDNISONE 10 MG (21) PO TBPK: ORAL_TABLET | Freq: Every day | ORAL | 0 refills | Status: DC

## 2023-12-11 MED ORDER — METHYLPREDNISOLONE ACETATE 80 MG/ML IJ SUSP
80.0000 mg | Freq: Once | INTRAMUSCULAR | Status: AC
  Administered 2023-12-11: 80 mg via INTRAMUSCULAR

## 2023-12-11 NOTE — Discharge Instructions (Addendum)
 Advised patient to take medication as directed with food to completion.  Encouraged increase daily water intake to 64 ounces per day while taking his medication.  Encouraged patient to change bed linens for the next 3 days to avoid recontamination.  Advised patient to wear new work gloves to avoid recontamination.  Advised if symptoms worsen and/or unresolved please follow-up with your PCP or here for further evaluation.

## 2023-12-11 NOTE — ED Triage Notes (Signed)
 Pt presents to uc with rash on bilateral hand and foot rash since Friday. Pt denies fever and sore throat. Pt reports he has attempted multiple otc with no improvement.

## 2023-12-11 NOTE — ED Provider Notes (Signed)
 [facility]    CSN: [id number] Arrival date & time: 12/11/23  1424      History   Chief Complaint Chief Complaint  Patient presents with   Rash    HPI Matthew Day is a 68 y.o. male.   HPI 68 year old male presents with a rash of bilateral hands and feet for 4 days.    Patient believes that he has been working in yard and was in contact with poison sumac. PMH significant for obesity and HTN.  Past Medical History:  Diagnosis Date   Hypertension    Joint pain     Patient Active Problem List   Diagnosis Date Noted   Fracture of ankle, medial malleolus, left, closed 12/27/2017    Past Surgical History:  Procedure Laterality Date   TONSILLECTOMY         Home Medications    Prior to Admission medications   Medication Sig Start Date End Date Taking? Authorizing Provider  predniSONE  (STERAPRED UNI-PAK 21 TAB) 10 MG (21) TBPK tablet Take by mouth daily. Take 6 tabs by mouth daily  for 2 days, then 5 tabs for 2 days, then 4 tabs for 2 days, then 3 tabs for 2 days, 2 tabs for 2 days, then 1 tab by mouth daily for 2 days 12/11/23  Yes Teddy Sharper, FNP  Ascorbic Acid (VITAMIN C) 100 MG tablet Take 100 mg by mouth daily.    [provider]  cholecalciferol (VITAMIN D3) 25 MCG (1000 UNIT) tablet Take 1,000 Units by mouth daily.    [provider]  dextromethorphan-guaiFENesin  (MUCINEX  DM) 30-600 MG 12hr tablet Take 1 tablet by mouth 2 (two) times daily. 09/06/23   Maranda Jamee Jacob, MD  losartan (COZAAR) 25 MG tablet Take 12.5 mg by mouth daily.    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  All other systems reviewed and are negative.    Physical Exam Triage Vital Signs ED Triage Vitals [12/11/23 1433]  Encounter Vitals Group     BP      Girls Systolic BP Percentile      Girls Diastolic BP Percentile      Boys Systolic BP Percentile      Boys Diastolic BP Percentile      Pulse      Resp      Temp      Temp src      SpO2      Weight      Height      Head Circumference      Peak Flow      Pain Score 0     Pain Loc      Pain Education      Exclude from Growth Chart    No data found.  Updated Vital Signs BP (!) 157/97   Pulse 85   Temp 98 F (36.7 C)   Resp 19   SpO2 94%    Physical Exam Vitals and nursing note reviewed.  Constitutional:      Appearance: Normal appearance. He is normal weight.  HENT:     Head: Normocephalic and atraumatic.     Mouth/Throat:     Mouth: Mucous membranes are moist.     Pharynx: Oropharynx is clear.   Eyes:     Extraocular Movements: Extraocular movements intact.     Conjunctiva/sclera: Conjunctivae normal.     Pupils: Pupils are equal, round, and reactive to light.    Cardiovascular:     Rate and Rhythm: Normal rate and regular rhythm.     Pulses: Normal pulses.     Heart sounds: Normal heart sounds.  Pulmonary:     Effort: Pulmonary effort is normal.     Breath sounds: Normal breath sounds. No wheezing, rhonchi or rales.   Musculoskeletal:        General: Normal range of motion.   Skin:    General: Skin is warm and dry.     Comments: Bilateral hands and fingers/right foot: Pruritic, erythematous, serous filled bullae noted please see images below   Neurological:     General: No focal deficit present.     Mental Status: He is alert and oriented to person, place, and time. Mental status is at baseline.   Psychiatric:        Mood and Affect: Mood normal.        Behavior: Behavior normal.         UC Treatments / Results  Labs (all labs  ordered are listed, but only abnormal results are displayed) Labs Reviewed - No data to display  EKG   Radiology No results found.  Procedures Procedures (including critical care time)  Medications Ordered in UC Medications  methylPREDNISolone  acetate (DEPO-MEDROL ) injection 80 mg (80 mg Intramuscular Given 12/11/23 1449)    Initial Impression / Assessment and Plan / UC Course  I have reviewed the triage vital signs and the nursing notes.  Pertinent labs & imaging results that were available during my care of the patient were reviewed by me and considered in my medical decision making (see chart for details).     MDM: 1.  Contact dermatitis due to poison sumac-IM Depo-Medrol  80 mg given once in clinic; 2.  Rash and nonspecific skin eruption-Rx'd Sterapred Unipak (42 tab 10 mg taper). Advised patient to take medication as directed with food to completion.  Encouraged increase daily water intake to 64 ounces per day while taking his medication.  Encouraged patient to change bed linens for the next 3 days to avoid recontamination.  Advised patient to wear new work gloves to avoid recontamination.  Advised if symptoms worsen and/or unresolved please follow-up with your PCP or here for further evaluation.  Patient discharged home, hemodynamically stable. Final Clinical Impressions(s) / UC Diagnoses   Final diagnoses:  Contact dermatitis due to poison [place]  Rash and nonspecific skin eruption     Discharge Instructions      Advised patient to take medication as directed with food to completion.  Encouraged increase daily water intake to 64 ounces per day while taking his medication.  Encouraged patient to change bed linens for the next 3 days to avoid recontamination.  Advised patient to wear new work gloves to avoid recontamination.  Advised if symptoms worsen and/or unresolved please follow-up with your PCP or here for further evaluation.     ED Prescriptions     Medication Sig  Dispense Auth. Provider   predniSONE  (STERAPRED UNI-PAK 21 TAB) 10 MG (21) TBPK tablet  Take by mouth daily. Take 6 tabs by mouth daily  for 2 days, then 5 tabs for 2 days, then 4 tabs for 2 days, then 3 tabs for 2 days, 2 tabs for 2 days, then 1 tab by mouth daily for 2 days 42 tablet Teddy Sharper, FNP      PDMP not reviewed this encounter.   Teddy Sharper, FNP 12/11/23 1453

## 2023-12-29 ENCOUNTER — Ambulatory Visit
Admission: EM | Admit: 2023-12-29 | Discharge: 2023-12-29 | Disposition: A | Discharge status: Home / Self Care | Attending: Family Medicine | Admitting: Family Medicine

## 2023-12-29 ENCOUNTER — Other Ambulatory Visit: Payer: Self-pay

## 2023-12-29 DIAGNOSIS — L03115 Cellulitis of right lower limb: Secondary | ICD-10-CM | POA: Diagnosis not present

## 2023-12-29 DIAGNOSIS — R21 Rash and other nonspecific skin eruption: Secondary | ICD-10-CM

## 2023-12-29 MED ORDER — METHYLPREDNISOLONE ACETATE 80 MG/ML IJ SUSP
80.0000 mg | Freq: Once | INTRAMUSCULAR | Status: AC
  Administered 2023-12-29: 80 mg via INTRAMUSCULAR

## 2023-12-29 MED ORDER — DOXYCYCLINE HYCLATE 100 MG PO CAPS: 100.0000 mg | ORAL_CAPSULE | Freq: Two times a day (BID) | ORAL | 0 refills | Status: AC

## 2023-12-29 MED ORDER — PREDNISONE 10 MG (21) PO TBPK: ORAL_TABLET | Freq: Every day | ORAL | 0 refills | Status: DC

## 2023-12-29 NOTE — ED Provider Notes (Signed)
 [facility]    CSN: [id number] Arrival date & time: 12/29/23  1458      History   Chief Complaint Chief Complaint  Patient presents with   Rash    HPI Matthew Day is a 68 y.o. male.   HPI Very pleasant 68 year old male presents with reoccurring pruritic erythematous rash hands, arms, and back.  Patient evaluated by me for contact dermatitis on 12/07/2023 please see epic for that encounter note.  PMH significant for HTN and joint pain.  Past Medical History:  Diagnosis Date   Hypertension    Joint pain     Patient Active Problem List   Diagnosis Date Noted   Fracture of ankle, medial malleolus, left, closed 12/27/2017    Past Surgical History:  Procedure Laterality Date   TONSILLECTOMY         Home Medications    Prior to Admission medications   Medication Sig Start Date End Date Taking? Authorizing Provider  doxycycline  (VIBRAMYCIN ) 100 MG capsule Take 1 capsule (100 mg total) by mouth 2 (two) times daily for 10 days. 12/29/23 01/08/24 Yes Teddy Sharper, FNP  predniSONE  (STERAPRED UNI-PAK 21 TAB) 10 MG (21) TBPK tablet Take by mouth daily. Take 6 tabs by mouth daily  for 2 days, then 5 tabs for 2 days, then 4 tabs for 2 days, then 3 tabs for 2 days, 2 tabs for 2 days, then 1 tab by mouth daily for 2 days 12/29/23  Yes Teddy Sharper, FNP  Ascorbic Acid (VITAMIN C) 100 MG tablet Take 100 mg by mouth daily.    [provider]  cholecalciferol (VITAMIN D3) 25 MCG (1000 UNIT) tablet Take 1,000 Units by mouth daily.    [provider]  dextromethorphan-guaiFENesin  (MUCINEX  DM) 30-600 MG 12hr tablet Take 1 tablet by mouth 2 (two) times daily. 09/06/23   Maranda Jamee Jacob, MD  losartan (COZAAR) 25 MG tablet Take 12.5 mg by mouth daily.    [provider]    Family History Family History  Problem Relation Age of Onset   COPD Father    Heart attack Father     Social History Social History   Tobacco Use   Smoking status:  Former   Smokeless tobacco: Never  Advertising account planner   Vaping status: Never Used  Substance Use Topics   Alcohol use: Not Currently   Drug use: Never     Allergies   Penicillins   Review of Systems Review of Systems  Skin:  Positive for rash.  All other systems reviewed and are negative.    Physical Exam Triage Vital Signs ED Triage Vitals  Encounter Vitals Group     BP 12/29/23 1510 (!) 144/87     Girls Systolic BP Percentile --      Girls Diastolic BP Percentile --      Boys Systolic BP Percentile --      Boys Diastolic BP Percentile --      Pulse Rate 12/29/23 1510 78     Resp 12/29/23 1510 19     Temp 12/29/23 1510 97.7 F (36.5 C)     Temp src --      SpO2 12/29/23 1510 98 %     Weight --      Height --      Head Circumference --      Peak Flow --      Pain Score 12/29/23 1508 0     Pain Loc --  Pain Education --      Exclude from Growth Chart --    No data found.  Updated Vital Signs BP (!) 144/87   Pulse 78   Temp 97.7 F (36.5 C)   Resp 19   SpO2 98%    Physical Exam Vitals and nursing note reviewed.  Constitutional:      Appearance: Normal appearance. He is obese.  HENT:     Head: Normocephalic and atraumatic.     Mouth/Throat:     Mouth: Mucous membranes are moist.     Pharynx: Oropharynx is clear.  Eyes:     Extraocular Movements: Extraocular movements intact.     Conjunctiva/sclera: Conjunctivae normal.     Pupils: Pupils are equal, round, and reactive to light.  Cardiovascular:     Rate and Rhythm: Normal rate and regular rhythm.     Pulses: Normal pulses.     Heart sounds: Normal heart sounds.  Pulmonary:     Effort: Pulmonary effort is normal.     Breath sounds: Normal breath sounds. No wheezing, rhonchi or rales.  Musculoskeletal:        General: Normal range of motion.  Skin:    General: Skin is warm and dry.     Comments: Bilateral hands (dorsum)/bilateral lower legs (anterior aspects): Pruritic, mildly erythematous  maculopapular eruption-please see image below; right foot (medial aspect of inferior arch): Erythematous, macerated, with serous effusions noted-please see image below  Neurological:     General: No focal deficit present.     Mental Status: He is alert and oriented to person, place, and time. Mental status is at baseline.  Psychiatric:        Mood and Affect: Mood normal.        Behavior: Behavior normal.         UC Treatments / Results  Labs (all labs ordered are listed, but only abnormal results are displayed) Labs Reviewed - No data to display  EKG   Radiology No results found.  Procedures Procedures (including critical care time)  Medications Ordered in UC Medications  methylPREDNISolone  acetate (DEPO-MEDROL ) injection 80 mg (80 mg Intramuscular Given 12/29/23 1528)    Initial Impression / Assessment and Plan / UC Course  I have reviewed the triage vital signs and the nursing notes.  Pertinent labs & imaging results that were available during my care of the patient were reviewed by me and considered in my medical decision making (see chart for details).     MDM: 1.  Rash and nonspecific skin eruption-IM Depo-Medrol  80 mg given once in clinic and prior to discharge, Rx'd Sterapred Unipak (42 tab 10 mg taper); 2.  Cellulitis of right foot-Rx'd doxycycline  100 mg capsule: Take 1 capsule twice daily x 10 days. Advised patient to take medications as directed with food to completion.  Instructed patient to take prednisone  with first dose of doxycycline  until completion encouraged to increase daily water intake to 64 ounces per day while taking these medications.  Advised patient please call your PCP on Monday, 12/31/2023 for a dermatology referral.  Patient discharged home, hemodynamically 6 stable. Final Clinical Impressions(s) / UC Diagnoses   Final diagnoses:  Rash and nonspecific skin eruption  Cellulitis of right foot     Discharge Instructions      Advised  patient to take medications as directed with food to completion.  Instructed patient to take prednisone  with first dose of doxycycline  until completion encouraged to increase daily water intake to 64 ounces per  day while taking these medications.  Advised patient please call your PCP on Monday, 12/31/2023 for a dermatology referral.      ED Prescriptions     Medication Sig Dispense Auth. Provider   doxycycline  (VIBRAMYCIN ) 100 MG capsule Take 1 capsule (100 mg total) by mouth 2 (two) times daily for 10 days. 20 capsule Andra Heslin, FNP   predniSONE  (STERAPRED UNI-PAK 21 TAB) 10 MG (21) TBPK tablet Take by mouth daily. Take 6 tabs by mouth daily  for 2 days, then 5 tabs for 2 days, then 4 tabs for 2 days, then 3 tabs for 2 days, 2 tabs for 2 days, then 1 tab by mouth daily for 2 days 42 tablet Teddy Sharper, FNP      PDMP not reviewed this encounter.   Teddy Sharper, FNP 12/29/23 1538

## 2023-12-29 NOTE — Discharge Instructions (Addendum)
 Advised patient to take medications as directed with food to completion.  Instructed patient to take prednisone  with first dose of doxycycline  until completion encouraged to increase daily water intake to 64 ounces per day while taking these medications.  Advised patient please call your PCP on Monday, 12/31/2023 for a dermatology referral.

## 2023-12-29 NOTE — ED Triage Notes (Signed)
 Pt presents to uc with co reoccurring rash. Pt reports he finished the prednisone  a few days ago and it has started back up on hands, arms and back.

## 2024-04-03 ENCOUNTER — Other Ambulatory Visit: Payer: Self-pay

## 2024-04-03 ENCOUNTER — Ambulatory Visit
Admission: EM | Admit: 2024-04-03 | Discharge: 2024-04-03 | Disposition: A | Discharge status: Home / Self Care | Attending: Family Medicine | Admitting: Family Medicine

## 2024-04-03 DIAGNOSIS — Z008 Encounter for other general examination: Secondary | ICD-10-CM | POA: Diagnosis not present

## 2024-04-03 HISTORY — DX: Hyperlipidemia, unspecified: E78.5

## 2024-04-03 MED ORDER — TIZANIDINE HCL 4 MG PO TABS: 4.0000 mg | ORAL_TABLET | Freq: Four times a day (QID) | ORAL | 0 refills | Status: AC | PRN
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
